# Patient Record
Sex: Male | Born: 1950 | Race: White | Hispanic: No | Marital: Married | Smoking: Never smoker
Health system: Southern US, Community
[De-identification: ages and names within clinical notes are randomized; demographics above are authoritative.]

## PROBLEM LIST (undated history)

## (undated) DIAGNOSIS — S4990XA Unspecified injury of shoulder and upper arm, unspecified arm, initial encounter: Secondary | ICD-10-CM

## (undated) DIAGNOSIS — E785 Hyperlipidemia, unspecified: Secondary | ICD-10-CM

## (undated) DIAGNOSIS — R030 Elevated blood-pressure reading, without diagnosis of hypertension: Secondary | ICD-10-CM

## (undated) HISTORY — PX: SHOULDER SURGERY: SHX246

## (undated) HISTORY — PX: TONSILLECTOMY: SUR1361

## (undated) HISTORY — DX: Unspecified injury of shoulder and upper arm, unspecified arm, initial encounter: S49.90XA

## (undated) HISTORY — PX: KNEE ARTHROSCOPY: SHX127

## (undated) HISTORY — DX: Hyperlipidemia, unspecified: E78.5

---

## 1984-11-03 HISTORY — PX: CHOLECYSTECTOMY: SHX55

## 2005-11-03 HISTORY — PX: COLONOSCOPY: SHX174

## 2006-10-23 ENCOUNTER — Other Ambulatory Visit: Payer: Self-pay

## 2006-10-29 ENCOUNTER — Ambulatory Visit: Payer: Self-pay | Admitting: Surgery

## 2006-11-03 HISTORY — PX: HERNIA REPAIR: SHX51

## 2009-04-11 ENCOUNTER — Ambulatory Visit: Payer: Self-pay | Admitting: Otolaryngology

## 2009-12-17 ENCOUNTER — Encounter: Payer: Self-pay | Admitting: Family Medicine

## 2010-01-01 ENCOUNTER — Encounter: Payer: Self-pay | Admitting: Family Medicine

## 2013-04-16 ENCOUNTER — Ambulatory Visit: Payer: Self-pay | Admitting: Family Medicine

## 2014-04-19 ENCOUNTER — Emergency Department: Payer: Self-pay | Admitting: Emergency Medicine

## 2014-04-19 LAB — CBC
HCT: 46.3 % (ref 40.0–52.0)
HCT: 47 % (ref 40.0–52.0)
HGB: 15.4 g/dL (ref 13.0–18.0)
HGB: 15.1 g/dL (ref 13.0–18.0)
MCH: 30.4 pg (ref 26.0–34.0)
MCH: 29.5 pg (ref 26.0–34.0)
MCHC: 33.3 g/dL (ref 32.0–36.0)
MCHC: 32.1 g/dL (ref 32.0–36.0)
MCV: 91 fL (ref 80–100)
MCV: 92 fL (ref 80–100)
PLATELETS: 237 10*3/uL (ref 150–440)
Platelet: 193 10*3/uL (ref 150–440)
RBC: 5.08 10*6/uL (ref 4.40–5.90)
RBC: 5.11 10*6/uL (ref 4.40–5.90)
RDW: 14.3 % (ref 11.5–14.5)
RDW: 14.6 % — ABNORMAL HIGH (ref 11.5–14.5)
WBC: 7.8 10*3/uL (ref 3.8–10.6)
WBC: 13.5 10*3/uL — AB (ref 3.8–10.6)

## 2014-04-19 LAB — PROTIME-INR
INR: 1
INR: 1
Prothrombin Time: 12.6 secs (ref 11.5–14.7)
Prothrombin Time: 12.8 secs (ref 11.5–14.7)

## 2014-04-19 LAB — APTT
ACTIVATED PTT: 27.6 s (ref 23.6–35.9)
Activated PTT: 29.2 secs (ref 23.6–35.9)

## 2015-06-08 ENCOUNTER — Ambulatory Visit (INDEPENDENT_AMBULATORY_CARE_PROVIDER_SITE_OTHER): Payer: BC Managed Care – PPO | Admitting: Family Medicine

## 2015-06-08 ENCOUNTER — Encounter: Payer: Self-pay | Admitting: Family Medicine

## 2015-06-08 VITALS — BP 118/80 | HR 80 | Ht 72.0 in | Wt 195.0 lb

## 2015-06-08 DIAGNOSIS — Z8249 Family history of ischemic heart disease and other diseases of the circulatory system: Secondary | ICD-10-CM | POA: Diagnosis not present

## 2015-06-08 DIAGNOSIS — Z8639 Personal history of other endocrine, nutritional and metabolic disease: Secondary | ICD-10-CM

## 2015-06-08 DIAGNOSIS — E782 Mixed hyperlipidemia: Secondary | ICD-10-CM | POA: Insufficient documentation

## 2015-06-08 DIAGNOSIS — E663 Overweight: Secondary | ICD-10-CM

## 2015-06-08 DIAGNOSIS — M1711 Unilateral primary osteoarthritis, right knee: Secondary | ICD-10-CM

## 2015-06-08 NOTE — Progress Notes (Signed)
Date:  06/08/2015   Name:  Anthony Foley   DOB:  [birth year]   MRN:  [id number]  PCP:  Adline Potter, MD    Chief Complaint: Establish Care   History of Present Illness:  This is a 64 y.o. male generally quite healthy, does yoga several times a week, has had R shoulder surgery, hernia repair, and R knee scope, told next step would be TKR. Father with AAA, wants checked, never smoker. Father also has MI's in 78's, mother had WPW syndrome, sister with hx cervical cancer. Pt with hx high chol on statin in past but taken off as MD said no longer needed.  Review of Systems:  Review of Systems  Constitutional: Negative for unexpected weight change.  HENT: Negative for ear pain and sore throat.   Eyes: Negative for pain.  Respiratory: Negative for shortness of breath.   Cardiovascular: Negative for chest pain, palpitations and leg swelling.  Gastrointestinal: Negative for abdominal pain.  Endocrine: Negative for polyuria.  Genitourinary: Negative for flank pain and difficulty urinating.  Musculoskeletal: Negative for back pain.  Skin: Negative for rash.  Neurological: Negative for syncope and headaches.  Hematological: Negative for adenopathy.  Psychiatric/Behavioral: Negative for confusion and sleep disturbance.    Patient Active Problem List   Diagnosis Date Noted  . FH: abdominal aortic aneurysm 06/08/2015  . Overweight (BMI 25.0-29.9) 06/08/2015  . Hx of hyperlipidemia 06/08/2015    Prior to Admission medications   Not on File    No Known Allergies  Past Surgical History  Procedure Laterality Date  . Cholecystectomy  1986  . Hernia repair  2008  . Shoulder surgery    . Knee arthroscopy    . Colonoscopy  2007    divert- [facility]    History  Substance Use Topics  . Smoking status: Never Smoker   . Smokeless tobacco: Not on file  . Alcohol Use: 0.0 oz/week    0 Standard drinks or equivalent per week    Family History  Problem Relation Age of Onset  .  Heart disease Mother   . Heart disease Father   . Cancer Sister     Medication list has been reviewed and updated.  Physical Examination: BP 118/80 mmHg  Pulse 80  Ht 6' (1.829 m)  Wt 195 lb (88.451 kg)  BMI 26.44 kg/m2  Physical Exam  Constitutional: He is oriented to person, place, and time. He appears well-developed and well-nourished.  HENT:  Head: Normocephalic and atraumatic.  Right Ear: External ear normal.  Left Ear: External ear normal.  Nose: Nose normal.  Mouth/Throat: Oropharynx is clear and moist.  Eyes: Conjunctivae and EOM are normal.  Neck: Neck supple. No thyromegaly present.  Cardiovascular: Normal rate, regular rhythm and normal heart sounds.   Pulmonary/Chest: Effort normal and breath sounds normal.  Abdominal: Soft. He exhibits no distension and no mass. There is no tenderness.  Musculoskeletal: He exhibits no edema.  Lymphadenopathy:    He has no cervical adenopathy.  Neurological: He is alert and oriented to person, place, and time. Coordination normal.  Skin: Skin is warm and dry. No rash noted.  Psychiatric: He has a normal mood and affect. His behavior is normal.    Assessment and Plan:  1. FH: abdominal aortic aneurysm - US Aorta; Future  2. Overweight (BMI 25.0-29.9) Discussed diet, exercise - Comprehensive Metabolic Panel (CMET) - CBC - Vitamin D (25 hydroxy)  3. Hx of hyperlipidemia Check level, ok with staying off statin unless  clear indication - Lipid Profile  4. Primary osteoarthritis of right knee Advised avoiding elective surgery if possible  5. FH: coronary artery disease  6. Health maintenance Need to address tetanus and colonoscopy status next vist  Return in about 6 months (around 12/09/2015).  Satira Anis. Ambree Frances, [facility]  06/08/2015

## 2015-06-08 NOTE — Addendum Note (Signed)
Addended by: Fredderick Severance on: 06/08/2015 04:07 PM   Modules accepted: Orders

## 2015-06-19 ENCOUNTER — Ambulatory Visit
Admission: RE | Admit: 2015-06-19 | Discharge: 2015-06-19 | Disposition: A | Discharge status: Home / Self Care | Payer: BC Managed Care – PPO | Source: Ambulatory Visit | Attending: Family Medicine | Admitting: Family Medicine

## 2015-06-19 DIAGNOSIS — Z8249 Family history of ischemic heart disease and other diseases of the circulatory system: Secondary | ICD-10-CM | POA: Insufficient documentation

## 2015-06-21 ENCOUNTER — Telehealth: Payer: Self-pay | Admitting: Family Medicine

## 2015-06-21 NOTE — Telephone Encounter (Signed)
Please call about overdue labs

## 2015-06-23 LAB — CBC
Hematocrit: 44.6 % (ref 37.5–51.0)
Hemoglobin: 14.9 g/dL (ref 12.6–17.7)
MCH: 29.8 pg (ref 26.6–33.0)
MCHC: 33.4 g/dL (ref 31.5–35.7)
MCV: 89 fL (ref 79–97)
Platelets: 258 10*3/uL (ref 150–379)
RBC: 5 x10E6/uL (ref 4.14–5.80)
RDW: 15 % (ref 12.3–15.4)
WBC: 5.9 10*3/uL (ref 3.4–10.8)

## 2015-06-23 LAB — COMPREHENSIVE METABOLIC PANEL
ALBUMIN: 4.5 g/dL (ref 3.6–4.8)
ALT: 16 IU/L (ref 0–44)
AST: 15 IU/L (ref 0–40)
Albumin/Globulin Ratio: 1.7 (ref 1.1–2.5)
Alkaline Phosphatase: 78 IU/L (ref 39–117)
BUN / CREAT RATIO: 20 (ref 10–22)
BUN: 21 mg/dL (ref 8–27)
Bilirubin Total: 0.4 mg/dL (ref 0.0–1.2)
CALCIUM: 9.3 mg/dL (ref 8.6–10.2)
CO2: 24 mmol/L (ref 18–29)
CREATININE: 1.04 mg/dL (ref 0.76–1.27)
Chloride: 100 mmol/L (ref 97–108)
GFR calc Af Amer: 89 mL/min/{1.73_m2} (ref >59)
GFR, EST NON AFRICAN AMERICAN: 77 mL/min/{1.73_m2} (ref >59)
GLOBULIN, TOTAL: 2.7 g/dL (ref 1.5–4.5)
Glucose: 77 mg/dL (ref 65–99)
Potassium: 4.2 mmol/L (ref 3.5–5.2)
Sodium: 141 mmol/L (ref 134–144)
TOTAL PROTEIN: 7.2 g/dL (ref 6.0–8.5)

## 2015-06-23 LAB — LIPID PANEL
CHOL/HDL RATIO: 4.7 ratio (ref 0.0–5.0)
Cholesterol, Total: 248 mg/dL — ABNORMAL HIGH (ref 100–199)
HDL: 53 mg/dL (ref >39)
LDL CALC: 166 mg/dL — AB (ref 0–99)
Triglycerides: 146 mg/dL (ref 0–149)
VLDL Cholesterol Cal: 29 mg/dL (ref 5–40)

## 2015-06-23 LAB — VITAMIN D 25 HYDROXY (VIT D DEFICIENCY, FRACTURES): Vit D, 25-Hydroxy: 27.6 ng/mL — ABNORMAL LOW (ref 30.0–100.0)

## 2015-06-25 ENCOUNTER — Encounter: Payer: Self-pay | Admitting: Family Medicine

## 2015-06-25 DIAGNOSIS — E559 Vitamin D deficiency, unspecified: Secondary | ICD-10-CM | POA: Insufficient documentation

## 2015-09-03 ENCOUNTER — Ambulatory Visit (INDEPENDENT_AMBULATORY_CARE_PROVIDER_SITE_OTHER): Payer: BC Managed Care – PPO

## 2015-09-03 DIAGNOSIS — Z23 Encounter for immunization: Secondary | ICD-10-CM | POA: Diagnosis not present

## 2016-09-04 ENCOUNTER — Ambulatory Visit (INDEPENDENT_AMBULATORY_CARE_PROVIDER_SITE_OTHER): Payer: BC Managed Care – PPO

## 2016-09-04 DIAGNOSIS — Z23 Encounter for immunization: Secondary | ICD-10-CM | POA: Diagnosis not present

## 2016-09-05 ENCOUNTER — Encounter: Payer: Self-pay | Admitting: Internal Medicine

## 2016-09-05 ENCOUNTER — Ambulatory Visit (INDEPENDENT_AMBULATORY_CARE_PROVIDER_SITE_OTHER): Payer: BC Managed Care – PPO | Admitting: Internal Medicine

## 2016-09-05 VITALS — BP 118/82 | HR 68 | Resp 16 | Ht 72.0 in | Wt 197.0 lb

## 2016-09-05 DIAGNOSIS — R1013 Epigastric pain: Secondary | ICD-10-CM | POA: Diagnosis not present

## 2016-09-05 NOTE — Progress Notes (Signed)
    Date:  09/05/2016   Name:  Anthony Foley   DOB:  01/16/1951   MRN:  [id number]   Chief Complaint: Abdominal Pain (LLQ pain under Rib x 2 wks and worse in epigastric region over last few days after eating. No BM or Urine issues at all. Going to [place] 11/24 and wants to be sure he is ok before going. ) Abdominal Pain  This is a new problem. The current episode started 1 to 4 weeks ago. The problem occurs every several days. The problem has been waxing and waning. The pain is located in the epigastric region and LLQ. Pertinent negatives include no anorexia, arthralgias, constipation, diarrhea, fever, headaches, myalgias, nausea, vomiting or weight loss. The pain is aggravated by eating.  No smoking or alcohol.  He takes advil intermittently but no other over the counter or prescription medication.    Review of Systems  Constitutional: Negative for fever and weight loss.  Respiratory: Negative for chest tightness, shortness of breath and wheezing.   Cardiovascular: Negative for chest pain.  Gastrointestinal: Positive for abdominal pain. Negative for abdominal distention, anorexia, constipation, diarrhea, nausea and vomiting.  Musculoskeletal: Negative for arthralgias and myalgias.  Neurological: Negative for headaches.    Patient Active Problem List   Diagnosis Date Noted  . Vitamin D insufficiency 06/25/2015  . FH: abdominal aortic aneurysm 06/08/2015  . Overweight (BMI 25.0-29.9) 06/08/2015  . Hx of hyperlipidemia 06/08/2015    Prior to Admission medications   Not on File    No Known Allergies  Past Surgical History:  Procedure Laterality Date  . CHOLECYSTECTOMY  1986  . COLONOSCOPY  2007   divert- [facility]  . HERNIA REPAIR  2008  . KNEE ARTHROSCOPY    . SHOULDER SURGERY      Social History  Substance Use Topics  . Smoking status: Never Smoker  . Smokeless tobacco: Never Used  . Alcohol use 0.0 oz/week     Medication list has been reviewed and  updated.   Physical Exam  Constitutional: He is oriented to person, place, and time. He appears well-developed. No distress.  HENT:  Head: Normocephalic and atraumatic.  Pulmonary/Chest: Effort normal. No respiratory distress.  Abdominal: Soft. Normal appearance and bowel sounds are normal. There is no hepatosplenomegaly. There is tenderness in the left upper quadrant. There is no rigidity, no rebound, no guarding and no CVA tenderness.  Musculoskeletal: Normal range of motion.  Neurological: He is alert and oriented to person, place, and time.  Skin: Skin is warm and dry. No rash noted.  Psychiatric: He has a normal mood and affect. His speech is normal and behavior is normal. Thought content normal.  Nursing note and vitals reviewed.   BP 118/82   Pulse 68   Resp 16   Ht 6' (1.829 m)   Wt 197 lb (89.4 kg)   BMI 26.72 kg/m   Assessment and Plan: 1. Dyspepsia Begin prilosec 20 mg bid and probiotic Return in 10 days if no improvement or worsening    Halina Maidens, MD [facility]  09/05/2016

## 2016-09-05 NOTE — Patient Instructions (Signed)
Prilosec 20 mg twice a day for 7-10 days  Probiotic capsules for 10 days as directed

## 2016-10-09 ENCOUNTER — Ambulatory Visit
Admission: EM | Admit: 2016-10-09 | Discharge: 2016-10-09 | Disposition: A | Discharge status: Home / Self Care | Payer: BC Managed Care – PPO | Attending: Family Medicine | Admitting: Family Medicine

## 2016-10-09 DIAGNOSIS — B9789 Other viral agents as the cause of diseases classified elsewhere: Secondary | ICD-10-CM

## 2016-10-09 DIAGNOSIS — J069 Acute upper respiratory infection, unspecified: Secondary | ICD-10-CM | POA: Diagnosis not present

## 2016-10-09 NOTE — Discharge Instructions (Signed)
Recommend take Delsym every 12 hours as needed for cough. Increase fluid intake to help loosen mucus in chest. Follow-up with your primary care provider in 3 to 4 days if symptoms worsen.

## 2016-10-09 NOTE — ED Triage Notes (Signed)
Pt c/o chest congestion for several days. He is just back from a trip to [place] and he was up all night coughing.

## 2016-10-09 NOTE — ED Provider Notes (Signed)
CSN: [id number]     Arrival date & time 10/09/16  0944 History   None    Chief Complaint  Patient presents with  . Nasal Congestion   (Consider location/radiation/quality/duration/timing/severity/associated sxs/prior Treatment) 65 year old male presents with cough and slight chest congestion for the past 3 days. Also had slight chills and sore throat last night. Denies any fever, nasal congestion, wheezing, shortness of breath or GI symptoms. Recently spent 10 days in [place] for work and returned 4 days ago. Had difficulty sleeping last night due to the cough but feels better today. Has taken OTC Nighttime cold medication last night with some relief. Takes no daily medication.   The history is provided by the patient.    History reviewed. No pertinent past medical history. Past Surgical History:  Procedure Laterality Date  . CHOLECYSTECTOMY  1986  . COLONOSCOPY  2007   divert- [facility]  . HERNIA REPAIR  2008  . KNEE ARTHROSCOPY    . SHOULDER SURGERY     Family History  Problem Relation Age of Onset  . Heart disease Mother   . Heart disease Father   . Cancer Sister   . Birth defects Paternal Grandfather    Social History  Substance Use Topics  . Smoking status: Never Smoker  . Smokeless tobacco: Never Used  . Alcohol use 0.0 oz/week    Review of Systems  Constitutional: Positive for chills. Negative for appetite change, fatigue and fever.  HENT: Positive for postnasal drip and sore throat. Negative for congestion, ear discharge, ear pain, rhinorrhea, sinus pain and sinus pressure.   Eyes: Negative for discharge.  Respiratory: Positive for cough. Negative for chest tightness, shortness of breath and wheezing.   Cardiovascular: Negative for chest pain.  Gastrointestinal: Negative for abdominal pain, diarrhea, nausea and vomiting.  Musculoskeletal: Negative for neck pain and neck stiffness.  Skin: Negative for rash.  Neurological: Negative for dizziness, syncope,  weakness, light-headedness, numbness and headaches.  Hematological: Negative for adenopathy.    Allergies  Patient has no known allergies.  Home Medications   Prior to Admission medications   Not on File   Meds Ordered and Administered this Visit  Medications - No data to display  BP 120/83 (BP Location: Left Arm)   Pulse 87   Temp 98.8 F (37.1 C) (Oral)   Resp 18   Ht 6' (1.829 m)   Wt 195 lb (88.5 kg)   SpO2 100%   BMI 26.45 kg/m  No data found.   Physical Exam  Constitutional: He is oriented to person, place, and time. He appears well-developed and well-nourished. He does not appear ill. No distress.  HENT:  Head: Normocephalic and atraumatic.  Right Ear: Hearing, tympanic membrane, external ear and ear canal normal.  Left Ear: Hearing, tympanic membrane, external ear and ear canal normal.  Nose: Rhinorrhea present. Right sinus exhibits no maxillary sinus tenderness and no frontal sinus tenderness. Left sinus exhibits no maxillary sinus tenderness and no frontal sinus tenderness.  Mouth/Throat: Uvula is midline and mucous membranes are normal. Posterior oropharyngeal erythema present.  Neck: Normal range of motion. Neck supple.  Cardiovascular: Normal rate, regular rhythm and normal heart sounds.   Pulmonary/Chest: Effort normal and breath sounds normal. No respiratory distress. He has no decreased breath sounds. He has no wheezes. He has no rhonchi.  Lymphadenopathy:    He has no cervical adenopathy.  Neurological: He is alert and oriented to person, place, and time.  Skin: Skin is warm and  dry.  Psychiatric: He has a normal mood and affect. His behavior is normal. Judgment and thought content normal.    Urgent Care Course   Clinical Course     Procedures (including critical care time)  Labs Review Labs Reviewed - No data to display  Imaging Review No results found.   Visual Acuity Review  Right Eye Distance:   Left Eye Distance:   Bilateral  Distance:    Right Eye Near:   Left Eye Near:    Bilateral Near:         MDM   1. Viral URI with cough    Reviewed with patient that he probably has a viral illness. Offered Rx strength cough medication but patient declined. Will trial OTC Delsym every 12 hours as needed for cough. Increase fluid intake to help loosen mucus in chest. Follow-up with his primary care provider in 3 to 4 days if not improving.     Katy Apo, NP 10/09/16 2008

## 2016-10-12 ENCOUNTER — Telehealth: Payer: Self-pay

## 2016-10-12 NOTE — Telephone Encounter (Signed)
Courtesy call back completed today for patient's recent visit at [facility]. Patient did not answer, left message on machine to call back with any questions or concerns.   

## 2016-10-15 ENCOUNTER — Encounter: Payer: Self-pay | Admitting: Family Medicine

## 2016-10-15 ENCOUNTER — Ambulatory Visit (INDEPENDENT_AMBULATORY_CARE_PROVIDER_SITE_OTHER): Payer: BC Managed Care – PPO | Admitting: Family Medicine

## 2016-10-15 VITALS — BP 122/82 | HR 71 | Resp 16 | Ht 72.0 in | Wt 199.0 lb

## 2016-10-15 DIAGNOSIS — Z8639 Personal history of other endocrine, nutritional and metabolic disease: Secondary | ICD-10-CM | POA: Diagnosis not present

## 2016-10-15 DIAGNOSIS — Z8249 Family history of ischemic heart disease and other diseases of the circulatory system: Secondary | ICD-10-CM | POA: Diagnosis not present

## 2016-10-15 DIAGNOSIS — E559 Vitamin D deficiency, unspecified: Secondary | ICD-10-CM | POA: Diagnosis not present

## 2016-10-15 DIAGNOSIS — Z23 Encounter for immunization: Secondary | ICD-10-CM | POA: Diagnosis not present

## 2016-10-15 DIAGNOSIS — J208 Acute bronchitis due to other specified organisms: Secondary | ICD-10-CM

## 2016-10-15 DIAGNOSIS — E663 Overweight: Secondary | ICD-10-CM

## 2016-10-16 LAB — VITAMIN D 25 HYDROXY (VIT D DEFICIENCY, FRACTURES): Vit D, 25-Hydroxy: 21 ng/mL — ABNORMAL LOW (ref 30.0–100.0)

## 2016-10-16 NOTE — Progress Notes (Signed)
Date:  10/15/2016   Name:  Anthony Foley   DOB:  February 13, 1951   MRN:  [id number]  PCP:  Adline Potter, MD    Chief Complaint: Annual Exam   History of Present Illness:  This is a 65 y.o. male seen for 16 month f/u. Seen MUC last week for cough, told viral, resolving. Dyspepsia from November resolved without meds. Occ pain L medial thigh, improving. Needs tetanus and zoster imm, has had two colonoscopies, last one 10 yrs ago, no polyps noted per pt. Vit D level low last year, advised increase sun exposure. Abd [place] last year negative.  Review of Systems:  Review of Systems  Constitutional: Negative for activity change, appetite change, fever and unexpected weight change.  Respiratory: Negative for cough and shortness of breath.   Cardiovascular: Negative for chest pain and leg swelling.  Gastrointestinal: Negative for abdominal pain.  Genitourinary: Negative for difficulty urinating.  Neurological: Negative for dizziness, syncope and light-headedness.    Patient Active Problem List   Diagnosis Date Noted  . Vitamin D insufficiency 06/25/2015  . FH: abdominal aortic aneurysm 06/08/2015  . Overweight (BMI 25.0-29.9) 06/08/2015  . Hx of hyperlipidemia 06/08/2015    Prior to Admission medications   Not on File    No Known Allergies  Past Surgical History:  Procedure Laterality Date  . CHOLECYSTECTOMY  1986  . COLONOSCOPY  2007   divert- [facility]  . HERNIA REPAIR  2008  . KNEE ARTHROSCOPY    . SHOULDER SURGERY      Social History  Substance Use Topics  . Smoking status: Never Smoker  . Smokeless tobacco: Never Used  . Alcohol use 0.0 oz/week    Family History  Problem Relation Age of Onset  . Heart disease Mother   . Heart disease Father   . Cancer Sister   . Birth defects Paternal Grandfather     Medication list has been reviewed and updated.  Physical Examination: BP 122/82   Pulse 71   Resp 16   Ht 6' (1.829 m)   Wt 199 lb (90.3 kg)   SpO2 100%    BMI 26.99 kg/m   Physical Exam  Constitutional: He appears well-developed and well-nourished.  Cardiovascular: Normal rate, regular rhythm and normal heart sounds.   Pulmonary/Chest: Effort normal and breath sounds normal.  Musculoskeletal: He exhibits no edema.  Neurological: He is alert.  Skin: Skin is warm and dry.  Psychiatric: He has a normal mood and affect. His behavior is normal.  Nursing note and vitals reviewed.   Assessment and Plan:  1. Viral bronchitis Resolving  2. Vitamin D insufficiency Recheck level - Vitamin D (25 hydroxy)  3. Overweight (BMI 25.0-29.9) Exercise/weight loss discussed  4. Hx of hyperlipidemia Lipids ok last year  5. FH: abdominal aortic aneurysm [place] negative last year  6. Need for diphtheria-tetanus-pertussis (Tdap) vaccine - Tdap vaccine greater than or equal to 7yo IM  7. Need for zoster vaccination Hold until Shingrix available  Return in about 1 year (around 10/15/2017).  Satira Anis. [facility]  10/16/2016

## 2017-04-17 ENCOUNTER — Ambulatory Visit
Admission: EM | Admit: 2017-04-17 | Discharge: 2017-04-17 | Disposition: A | Discharge status: Home / Self Care | Payer: BC Managed Care – PPO | Attending: Emergency Medicine | Admitting: Emergency Medicine

## 2017-04-17 ENCOUNTER — Encounter: Payer: Self-pay | Admitting: Emergency Medicine

## 2017-04-17 DIAGNOSIS — J029 Acute pharyngitis, unspecified: Secondary | ICD-10-CM | POA: Diagnosis not present

## 2017-04-17 LAB — RAPID STREP SCREEN (MED CTR MEBANE ONLY): Streptococcus, Group A Screen (Direct): NEGATIVE

## 2017-04-17 MED ORDER — IPRATROPIUM BROMIDE 0.06 % NA SOLN: 2.0000 | Freq: Four times a day (QID) | NASAL | 0 refills | Status: DC

## 2017-04-17 MED ORDER — FLUTICASONE PROPIONATE 50 MCG/ACT NA SUSP: 2.0000 | Freq: Every day | NASAL | 0 refills | Status: DC

## 2017-04-17 NOTE — ED Triage Notes (Signed)
Patient c/o sore throat for the past 4 days.  Patient denies fevers.

## 2017-04-17 NOTE — ED Provider Notes (Signed)
HPI  SUBJECTIVE:  Patient reports sore throat starting 4 days ago. Sx worse with not drinking fluids.  Sx better with ibuprofen 800 mg and drinking fluids.. Has been taking cold medicine w/ o relief.  No fevers No Swollen neck glands    + Cough/URI sxs with nasal congestion, rhinorrhea, postnasal drip No ear pain, sinus pain or pressure No Myalgias No Headache No Rash     No Recent Strep or mono Exposure No Abdominal Pain No reflux sxs No Allergy sxs  No Breathing difficulty, voice changes, sensation of throat swelling shut No Drooling No Trismus No abx in past month.  + antipyretic in past 6-8 hrs he is not a smoker He has a past medical history of seasonal allergies. No history of mono, strep, GERD, diabetes, hypertension.  [id number], Gwyndolyn Saxon, MD    History reviewed. No pertinent past medical history.  Past Surgical History:  Procedure Laterality Date  . CHOLECYSTECTOMY  1986  . COLONOSCOPY  2007   divert- [facility]  . HERNIA REPAIR  2008  . KNEE ARTHROSCOPY    . SHOULDER SURGERY      Family History  Problem Relation Age of Onset  . Heart disease Mother   . Heart disease Father   . Cancer Sister   . Birth defects Paternal Grandfather     Social History  Substance Use Topics  . Smoking status: Never Smoker  . Smokeless tobacco: Never Used  . Alcohol use 0.0 oz/week    No current facility-administered medications for this encounter.   Current Outpatient Prescriptions:  .  fluticasone (FLONASE) 50 MCG/ACT nasal spray, Place 2 sprays into both nostrils daily., Disp: 16 g, Rfl: 0 .  ipratropium (ATROVENT) 0.06 % nasal spray, Place 2 sprays into both nostrils 4 (four) times daily. 3-4 times/ day, Disp: 15 mL, Rfl: 0  No Known Allergies   ROS  As noted in HPI.   Physical Exam  BP 121/89 (BP Location: Left Arm)   Pulse 93   Temp 98.7 F (37.1 C) (Oral)   Resp 16   Ht 6' (1.829 m)   Wt 195 lb (88.5 kg)   SpO2 100%   BMI 26.45 kg/m    Constitutional: Well developed, well nourished, no acute distress Eyes:  EOMI, conjunctiva normal bilaterally HENT: Normocephalic, atraumatic,mucus membranes moist. + clear nasal congestion +erythematous oropharynx.  tonsils surgically absent. Positive cobblestoning, postnasal drip. Uvula midline.  Respiratory: Normal inspiratory effort Cardiovascular: Normal rate, no murmurs, rubs, gallops GI: nondistended, nontender. No appreciable splenomegaly skin: No rash, skin intact Lymph: -  cervical LN  Musculoskeletal: no deformities Neurologic: Alert & oriented x 3, no focal neuro deficits Psychiatric: Speech and behavior appropriate.  ED Course   Medications - No data to display  Orders Placed This Encounter  Procedures  . Rapid strep screen    Standing Status:   Standing    Number of Occurrences:   1  . Culture, group A strep    Standing Status:   Standing    Number of Occurrences:   1    Results for orders placed or performed during the hospital encounter of 04/17/17 (from the past 24 hour(s))  Rapid strep screen     Status: None   Collection Time: 04/17/17  3:21 PM  Result Value Ref Range   Streptococcus, Group A Screen (Direct) NEGATIVE NEGATIVE   No results found.  ED Clinical Impression  Pharyngitis, unspecified etiology   ED Assessment/Plan   Rapid strep negative. Obtaining throat  culture to guide antibiotic treatment. Discussed this with patient. We'll contact them if culture is positive, and will call in Appropriate antibiotics. Patient home with ibuprofen, Tylenol- states does not need a prescription for this Benadryl/Maalox mixture, Atrovent nasal spray for the nasal congestion postnasal drip and Flonase if this does not work.  Patient to followup with PMD when necessary,  Discussed labs,  MDM, plan and followup with patient  Discussed sn/sx that should prompt return to the ED. Patient.agrees with plan.   Meds ordered this encounter  Medications  .  fluticasone (FLONASE) 50 MCG/ACT nasal spray    Sig: Place 2 sprays into both nostrils daily.    Dispense:  16 g    Refill:  0  . ipratropium (ATROVENT) 0.06 % nasal spray    Sig: Place 2 sprays into both nostrils 4 (four) times daily. 3-4 times/ day    Dispense:  15 mL    Refill:  0     *This clinic note was created using [employer]. Therefore, there may be occasional mistakes despite careful proofreading.    Melynda Ripple, MD 04/17/17 [phone]

## 2017-04-17 NOTE — Discharge Instructions (Signed)
your rapid strep was negative today, so we have sent off a throat culture.  We will contact you and call in the appropriate antibiotics if your culture comes back positive for an infection requiring antibiotic treatment.  Give [place] a working phone number.   1 gram of Tylenol and 800 mg ibuprofen together 3 times a day as needed for pain.  Make sure you drink plenty of extra fluids.  Some people find salt water gargles and  Traditional Medicinal's "Throat Coat" tea helpful. Take 5 mL of liquid Benadryl and 5 mL of Maalox. Mix it together, and then hold it in your mouth for as long as you can and then swallow. You may do this 4 times a day.    Go to www.goodrx.com to look up your medications. This will give you a list of where you can find your prescriptions at the most affordable prices. Or ask the pharmacist what the cash price is. This can be less expensive than what you would pay with insurance.

## 2017-04-20 LAB — CULTURE, GROUP A STREP (THRC)

## 2017-04-21 ENCOUNTER — Encounter: Payer: Self-pay | Admitting: Family Medicine

## 2017-04-21 ENCOUNTER — Ambulatory Visit (INDEPENDENT_AMBULATORY_CARE_PROVIDER_SITE_OTHER): Payer: BC Managed Care – PPO | Admitting: Family Medicine

## 2017-04-21 VITALS — BP 122/78 | HR 79 | Resp 16 | Ht 72.0 in | Wt 197.0 lb

## 2017-04-21 DIAGNOSIS — M1710 Unilateral primary osteoarthritis, unspecified knee: Secondary | ICD-10-CM | POA: Insufficient documentation

## 2017-04-21 DIAGNOSIS — E559 Vitamin D deficiency, unspecified: Secondary | ICD-10-CM | POA: Diagnosis not present

## 2017-04-21 DIAGNOSIS — Z23 Encounter for immunization: Secondary | ICD-10-CM | POA: Diagnosis not present

## 2017-04-21 DIAGNOSIS — M719 Bursopathy, unspecified: Secondary | ICD-10-CM | POA: Insufficient documentation

## 2017-04-21 DIAGNOSIS — M6281 Muscle weakness (generalized): Secondary | ICD-10-CM | POA: Insufficient documentation

## 2017-04-21 DIAGNOSIS — M7550 Bursitis of unspecified shoulder: Secondary | ICD-10-CM

## 2017-04-21 DIAGNOSIS — J302 Other seasonal allergic rhinitis: Secondary | ICD-10-CM

## 2017-04-21 DIAGNOSIS — M25512 Pain in left shoulder: Secondary | ICD-10-CM | POA: Diagnosis not present

## 2017-04-21 DIAGNOSIS — M179 Osteoarthritis of knee, unspecified: Secondary | ICD-10-CM | POA: Insufficient documentation

## 2017-04-21 DIAGNOSIS — J309 Allergic rhinitis, unspecified: Secondary | ICD-10-CM | POA: Insufficient documentation

## 2017-04-21 DIAGNOSIS — M75112 Incomplete rotator cuff tear or rupture of left shoulder, not specified as traumatic: Secondary | ICD-10-CM | POA: Insufficient documentation

## 2017-04-21 DIAGNOSIS — M66329 Spontaneous rupture of flexor tendons, unspecified upper arm: Secondary | ICD-10-CM | POA: Insufficient documentation

## 2017-04-21 MED ORDER — ZOSTER VAC RECOMB ADJUVANTED 50 MCG/0.5ML IM SUSR: 0.5000 mL | Freq: Once | INTRAMUSCULAR | 1 refills | Status: AC

## 2017-04-21 MED ORDER — CETIRIZINE HCL 10 MG PO TABS: 10.0000 mg | ORAL_TABLET | Freq: Every day | ORAL | 11 refills | Status: DC

## 2017-04-21 NOTE — Progress Notes (Signed)
Date:  04/21/2017   Name:  Anthony Foley   DOB:  02-07-1951   MRN:  [id number]  PCP:  Adline Potter, MD    Chief Complaint: Allergies (Seen MUC Friday. He wakes with sore throat in mornings and hard to swallow. Some chest congestion but ok after coughing it up. Denies fever. )   History of Present Illness:  This is a 66 y.o. male c/o 10d hx sore throat, nasal congestion, watery eyes, sneezing. Seen MUC 4d ago, RST and throat cx negative, placed on Flonase and Atrovent NS, taking Benedryl PO. Also c/o persistent L shoulder pain similar to R shoulder pain dx'd as RC tear but not as severe, wonders if can start exercises PT taught. Needs zoster imm. Not taking vit D but more sun exposure over summer.  Review of Systems:  Review of Systems  Constitutional: Negative for chills and fever.  HENT: Negative for ear pain and trouble swallowing.   Respiratory: Negative for cough and shortness of breath.   Cardiovascular: Negative for chest pain and leg swelling.  Genitourinary: Negative for difficulty urinating.  Neurological: Negative for syncope and light-headedness.    Patient Active Problem List   Diagnosis Date Noted  . Disorder of bursae of shoulder region 04/21/2017  . Full thickness rotator cuff tear 04/21/2017  . Muscle weakness 04/21/2017  . Nontraumatic rupture of long head of biceps tendon 04/21/2017  . Osteoarthritis of knee 04/21/2017  . Shoulder joint pain 04/21/2017  . Stiffness of shoulder joint 04/21/2017  . Allergic rhinitis 04/21/2017  . Vitamin D insufficiency 06/25/2015  . FH: abdominal aortic aneurysm 06/08/2015  . Overweight (BMI 25.0-29.9) 06/08/2015  . Hx of hyperlipidemia 06/08/2015    Prior to Admission medications   Medication Sig Start Date End Date Taking? Authorizing Provider  fluticasone (FLONASE) 50 MCG/ACT nasal spray Place 2 sprays into both nostrils daily. 04/17/17  Yes Melynda Ripple, MD  cetirizine (ZYRTEC) 10 MG tablet Take 1 tablet (10 mg  total) by mouth daily. 04/21/17   Oseas Detty, Gwyndolyn Saxon, MD  Zoster Vac Recomb Adjuvanted [facility]) injection Inject 0.5 mLs into the muscle once. 04/21/17 04/21/17  Adline Potter, MD    No Known Allergies  Past Surgical History:  Procedure Laterality Date  . CHOLECYSTECTOMY  1986  . COLONOSCOPY  2007   divert- [place] [facility]  . HERNIA REPAIR  2008  . KNEE ARTHROSCOPY    . SHOULDER SURGERY      Social History  Substance Use Topics  . Smoking status: Never Smoker  . Smokeless tobacco: Never Used  . Alcohol use 0.0 oz/week    Family History  Problem Relation Age of Onset  . Heart disease Mother   . Heart disease Father   . Cancer Sister   . Birth defects Paternal Grandfather     Medication list has been reviewed and updated.  Physical Examination: BP 122/78   Pulse 79   Resp 16   Ht 6' (1.829 m)   Wt 197 lb (89.4 kg)   SpO2 99%   BMI 26.72 kg/m   Physical Exam  Constitutional: He appears well-developed and well-nourished.  HENT:  Mouth/Throat: Oropharynx is clear and moist.  No sinus tenderness  Cardiovascular: Normal rate, regular rhythm and normal heart sounds.   Pulmonary/Chest: Effort normal and breath sounds normal.  Musculoskeletal: He exhibits no edema.  L shoulder full ROM but tender over superior RC  Neurological: He is alert.  Skin: Skin is warm and dry.  Psychiatric: He has a normal  mood and affect. His behavior is normal.  Nursing note and vitals reviewed.   Assessment and Plan:  1. Seasonal allergic rhinitis, unspecified trigger Begin Zyrtec or Claritin with Flonase, d/c Benedryl and Atrovent, call if sxs worsen/persist,   2. Pain in joint of left shoulder Ok to begin home PT exercises, consider PT referral if sxs persist  3. Vitamin D insufficiency Likely improved with increased sun exposure, consider level next visit  4. Need for zoster vaccination - Zoster Vac Recomb Adjuvanted [facility]) injection; Inject 0.5 mLs into the muscle once.   Dispense: 0.5 mL; Refill: 1  Return in about 6 months (around 10/21/2017).  Satira Anis. [facility]  04/21/2017

## 2017-04-21 NOTE — Patient Instructions (Addendum)
Use Zyrtec or Claritin (generic ok) daily with Flonase, call if symptoms fail to improve. Call if left shoulder pain fails to improve with PT exercises.

## 2017-08-25 DIAGNOSIS — M25512 Pain in left shoulder: Secondary | ICD-10-CM | POA: Diagnosis not present

## 2017-08-26 ENCOUNTER — Other Ambulatory Visit: Payer: Self-pay | Admitting: Unknown Physician Specialty

## 2017-08-26 DIAGNOSIS — M25512 Pain in left shoulder: Secondary | ICD-10-CM

## 2017-08-29 ENCOUNTER — Ambulatory Visit
Admission: RE | Admit: 2017-08-29 | Discharge: 2017-08-29 | Disposition: A | Discharge status: Home / Self Care | Payer: PPO | Source: Ambulatory Visit | Attending: Unknown Physician Specialty | Admitting: Unknown Physician Specialty

## 2017-08-29 DIAGNOSIS — M7522 Bicipital tendinitis, left shoulder: Secondary | ICD-10-CM | POA: Insufficient documentation

## 2017-08-29 DIAGNOSIS — M75112 Incomplete rotator cuff tear or rupture of left shoulder, not specified as traumatic: Secondary | ICD-10-CM | POA: Insufficient documentation

## 2017-08-29 DIAGNOSIS — M25512 Pain in left shoulder: Secondary | ICD-10-CM | POA: Diagnosis present

## 2017-09-03 DIAGNOSIS — M7542 Impingement syndrome of left shoulder: Secondary | ICD-10-CM | POA: Diagnosis not present

## 2017-10-21 ENCOUNTER — Encounter: Payer: Self-pay | Admitting: Family Medicine

## 2017-10-21 ENCOUNTER — Ambulatory Visit: Payer: PPO | Admitting: Family Medicine

## 2017-10-21 VITALS — BP 118/76 | HR 76 | Resp 16 | Ht 72.0 in | Wt 195.8 lb

## 2017-10-21 DIAGNOSIS — E559 Vitamin D deficiency, unspecified: Secondary | ICD-10-CM | POA: Diagnosis not present

## 2017-10-21 DIAGNOSIS — Z0000 Encounter for general adult medical examination without abnormal findings: Secondary | ICD-10-CM | POA: Diagnosis not present

## 2017-10-21 DIAGNOSIS — Z23 Encounter for immunization: Secondary | ICD-10-CM

## 2017-10-21 DIAGNOSIS — J302 Other seasonal allergic rhinitis: Secondary | ICD-10-CM | POA: Diagnosis not present

## 2017-10-21 DIAGNOSIS — M75112 Incomplete rotator cuff tear or rupture of left shoulder, not specified as traumatic: Secondary | ICD-10-CM

## 2017-10-21 MED ORDER — CETIRIZINE HCL 10 MG PO TABS: 10.0000 mg | ORAL_TABLET | Freq: Every day | ORAL | 11 refills | Status: AC | PRN

## 2017-10-21 MED ORDER — FLUTICASONE PROPIONATE 50 MCG/ACT NA SUSP: 2.0000 | Freq: Every day | NASAL | 0 refills | Status: DC | PRN

## 2017-10-21 NOTE — Progress Notes (Signed)
Date:  10/21/2017   Name:  Anthony Foley   DOB:  Jan 05, 1951   MRN:  [id number]  PCP:  Adline Potter, MD    Chief Complaint: Follow-up (medicare wellness needed? patient inquiring )   History of Present Illness:  This is a 66 y.o. male seen for six month f/u. Saw ortho, MRI showed partial RC tear, improved with injection/PT. Using Claritin/Flonase prn for AR sxs. Off vit D supp past 2 months. Zoster imm on back order. Due for Prevnar now 77.  Review of Systems:  Review of Systems  Constitutional: Negative for chills and fever.  Respiratory: Negative for cough and shortness of breath.   Cardiovascular: Negative for chest pain and leg swelling.  Genitourinary: Negative for difficulty urinating.  Neurological: Negative for syncope and light-headedness.    Patient Active Problem List   Diagnosis Date Noted  . Disorder of bursae of shoulder region 04/21/2017  . Partial tear of left rotator cuff 04/21/2017  . Muscle weakness 04/21/2017  . Nontraumatic rupture of long head of biceps tendon 04/21/2017  . Osteoarthritis of knee 04/21/2017  . Shoulder joint pain 04/21/2017  . Stiffness of shoulder joint 04/21/2017  . Allergic rhinitis 04/21/2017  . Vitamin D insufficiency 06/25/2015  . FH: abdominal aortic aneurysm 06/08/2015  . Overweight (BMI 25.0-29.9) 06/08/2015  . Hx of hyperlipidemia 06/08/2015    Prior to Admission medications   Medication Sig Start Date End Date Taking? Authorizing Provider  cetirizine (ZYRTEC) 10 MG tablet Take 1 tablet (10 mg total) by mouth daily. 04/21/17  Yes Crystelle Ferrufino, Gwyndolyn Saxon, MD  fluticasone (FLONASE) 50 MCG/ACT nasal spray Place 2 sprays into both nostrils daily. 04/17/17  Yes Melynda Ripple, MD    No Known Allergies  Past Surgical History:  Procedure Laterality Date  . CHOLECYSTECTOMY  1986  . COLONOSCOPY  2007   divert- [place] [facility]  . HERNIA REPAIR  2008  . KNEE ARTHROSCOPY    . SHOULDER SURGERY      Social History   Tobacco Use   . Smoking status: Never Smoker  . Smokeless tobacco: Never Used  Substance Use Topics  . Alcohol use: Yes    Alcohol/week: 0.0 oz  . Drug use: No    Family History  Problem Relation Age of Onset  . Heart disease Mother   . Heart disease Father   . Cancer Sister   . Birth defects Paternal Grandfather   . COPD Maternal Aunt     Medication list has been reviewed and updated.  Physical Examination: BP 118/76   Pulse 76   Resp 16   Ht 6' (1.829 m)   Wt 195 lb 12.8 oz (88.8 kg)   SpO2 100%   BMI 26.56 kg/m   Physical Exam  Constitutional: He appears well-developed and well-nourished.  Cardiovascular: Normal rate, regular rhythm and normal heart sounds.  Pulmonary/Chest: Effort normal and breath sounds normal.  Musculoskeletal: He exhibits no edema.  Neurological: He is alert.  Skin: Skin is warm and dry.  Psychiatric: He has a normal mood and affect. His behavior is normal.  Nursing note and vitals reviewed.   Assessment and Plan:  1. Partial tear of left rotator cuff Improved with injection/PT, ortho following  2. Vitamin D insufficiency Off supp x 2 months - Vitamin D (25 hydroxy)  3. Seasonal allergic rhinitis, unspecified trigger Well controlled on prn Claritin/Flonase  4. Need for influenza vaccination - Flu vaccine HIGH DOSE PF (Fluzone High dose)  5. Need for pneumococcal vaccination -  Pneumococcal conjugate vaccine 13-valent  6. Healthcare maintenance - Hepatitis C Antibody - HIV antibody (with reflex)  Return in about 1 year (around 10/21/2018).  Satira Anis. [facility]  10/21/2017

## 2017-10-22 ENCOUNTER — Other Ambulatory Visit: Payer: Self-pay | Admitting: Family Medicine

## 2017-10-22 LAB — VITAMIN D 25 HYDROXY (VIT D DEFICIENCY, FRACTURES): Vit D, 25-Hydroxy: 28.8 ng/mL — ABNORMAL LOW (ref 30.0–100.0)

## 2017-10-22 LAB — HEPATITIS C ANTIBODY: Hep C Virus Ab: <0.1 s/co ratio (ref 0.0–0.9)

## 2017-10-22 LAB — HIV ANTIBODY (ROUTINE TESTING W REFLEX): HIV Screen 4th Generation wRfx: NONREACTIVE

## 2017-10-22 MED ORDER — VITAMIN D-3 25 MCG (1000 UT) PO CAPS: 1.0000 | ORAL_CAPSULE | Freq: Every day | ORAL | Status: AC

## 2018-09-20 ENCOUNTER — Encounter: Payer: PPO | Admitting: Family Medicine

## 2018-09-20 ENCOUNTER — Encounter: Payer: Self-pay | Admitting: Internal Medicine

## 2018-09-21 ENCOUNTER — Ambulatory Visit
Admission: RE | Admit: 2018-09-21 | Discharge: 2018-09-21 | Disposition: A | Discharge status: Home / Self Care | Payer: PPO | Source: Ambulatory Visit | Attending: Internal Medicine | Admitting: Internal Medicine

## 2018-09-21 ENCOUNTER — Ambulatory Visit (INDEPENDENT_AMBULATORY_CARE_PROVIDER_SITE_OTHER): Payer: PPO | Admitting: Internal Medicine

## 2018-09-21 ENCOUNTER — Encounter: Payer: Self-pay | Admitting: Internal Medicine

## 2018-09-21 VITALS — BP 120/80 | HR 66 | Ht 72.0 in | Wt 195.0 lb

## 2018-09-21 DIAGNOSIS — M25552 Pain in left hip: Secondary | ICD-10-CM | POA: Diagnosis not present

## 2018-09-21 DIAGNOSIS — E782 Mixed hyperlipidemia: Secondary | ICD-10-CM

## 2018-09-21 DIAGNOSIS — Z23 Encounter for immunization: Secondary | ICD-10-CM | POA: Diagnosis not present

## 2018-09-21 DIAGNOSIS — Z0000 Encounter for general adult medical examination without abnormal findings: Secondary | ICD-10-CM

## 2018-09-21 DIAGNOSIS — Z125 Encounter for screening for malignant neoplasm of prostate: Secondary | ICD-10-CM

## 2018-09-21 DIAGNOSIS — E559 Vitamin D deficiency, unspecified: Secondary | ICD-10-CM | POA: Diagnosis not present

## 2018-09-21 DIAGNOSIS — M48061 Spinal stenosis, lumbar region without neurogenic claudication: Secondary | ICD-10-CM | POA: Insufficient documentation

## 2018-09-21 DIAGNOSIS — Z1211 Encounter for screening for malignant neoplasm of colon: Secondary | ICD-10-CM

## 2018-09-21 DIAGNOSIS — M1612 Unilateral primary osteoarthritis, left hip: Secondary | ICD-10-CM | POA: Diagnosis not present

## 2018-09-21 LAB — POCT URINALYSIS DIPSTICK
BILIRUBIN UA: NEGATIVE
Glucose, UA: NEGATIVE
Ketones, UA: NEGATIVE
Leukocytes, UA: NEGATIVE
NITRITE UA: NEGATIVE
PH UA: 5 (ref 5.0–8.0)
PROTEIN UA: NEGATIVE
Spec Grav, UA: 1.015 (ref 1.010–1.025)
UROBILINOGEN UA: 0.2 U/dL

## 2018-09-21 MED ORDER — ZOSTER VAC RECOMB ADJUVANTED 50 MCG/0.5ML IM SUSR: 0.5000 mL | Freq: Once | INTRAMUSCULAR | 1 refills | Status: AC

## 2018-09-21 NOTE — Progress Notes (Signed)
Date:  09/21/2018   Name:  Anthony Foley   DOB:  [birth year]   MRN:  [id number]   Chief Complaint: Annual Exam (Was Previous [name] patient. High dose Flu shot. ) Anthony Foley is a 67 y.o. male who presents today for his Complete Annual Exam. He feels well. He reports exercising regularly. He reports he is sleeping well. He is due for a 10 yr colonoscopy.  He also needs flu vaccine.  He is also interested in shingles vaccine.  He will need to return in a month for PPV-23.  Hip Pain   There was no injury mechanism. The pain is present in the left hip. The quality of the pain is described as cramping. The pain is moderate. The pain has been intermittent since onset. Associated symptoms include an inability to bear weight and a loss of motion. He has tried nothing for the symptoms.   Inguinal pain - had a hernia repair on the right 10 years ago.  Now doing yoga and having some pulling in the right groin down to the scrotum.  No swelling or pain except when doing certain poses.  Review of Systems  Constitutional: Negative for appetite change, chills, diaphoresis, fatigue and unexpected weight change.  HENT: Negative for hearing loss, tinnitus, trouble swallowing and voice change.   Eyes: Negative for visual disturbance.  Respiratory: Negative for choking, shortness of breath and wheezing.   Cardiovascular: Positive for palpitations. Negative for chest pain and leg swelling.  Gastrointestinal: Negative for abdominal pain, blood in stool, constipation and diarrhea.  Genitourinary: Negative for difficulty urinating, dysuria and frequency.  Musculoskeletal: Negative for arthralgias, back pain and myalgias.  Skin: Negative for color change and rash.  Allergic/Immunologic: Positive for environmental allergies.  Neurological: Negative for dizziness, syncope and headaches.  Hematological: Negative for adenopathy.  Psychiatric/Behavioral: Negative for dysphoric mood and sleep disturbance.     Patient Active Problem List   Diagnosis Date Noted  . Pain of left hip joint 09/21/2018  . Disorder of bursae of shoulder region 04/21/2017  . Partial tear of left rotator cuff 04/21/2017  . Muscle weakness 04/21/2017  . Nontraumatic rupture of long head of biceps tendon 04/21/2017  . Osteoarthritis of knee 04/21/2017  . Allergic rhinitis 04/21/2017  . Vitamin D insufficiency 06/25/2015  . FH: abdominal aortic aneurysm 06/08/2015  . Overweight (BMI 25.0-29.9) 06/08/2015  . Mixed hyperlipidemia 06/08/2015    No Known Allergies  Past Surgical History:  Procedure Laterality Date  . CHOLECYSTECTOMY  1986  . COLONOSCOPY  2007   divert- [place] [facility]  . HERNIA REPAIR  2008   right inguinal  . KNEE ARTHROSCOPY    . SHOULDER SURGERY      Social History   Tobacco Use  . Smoking status: Never Smoker  . Smokeless tobacco: Never Used  Substance Use Topics  . Alcohol use: Yes    Comment: once monthly- rare  . Drug use: No     Medication list has been reviewed and updated.  Current Meds  Medication Sig  . cetirizine (ZYRTEC) 10 MG tablet Take 1 tablet (10 mg total) by mouth daily as needed for allergies.  . Cholecalciferol (VITAMIN D-3) 1000 units CAPS Take 1 capsule (1,000 Units total) by mouth daily.  . fluticasone (FLONASE) 50 MCG/ACT nasal spray Place 2 sprays into both nostrils daily as needed for allergies or rhinitis.    PHQ 2/9 Scores 10/21/2017 06/08/2015  PHQ - 2 Score 0 0    Physical  Exam  Constitutional: He is oriented to person, place, and time. He appears well-developed and well-nourished.  HENT:  Head: Normocephalic.  Right Ear: Tympanic membrane, external ear and ear canal normal.  Left Ear: Tympanic membrane, external ear and ear canal normal.  Nose: Nose normal.  Mouth/Throat: Uvula is midline and oropharynx is clear and moist.  Eyes: Pupils are equal, round, and reactive to light. Conjunctivae and EOM are normal.  Neck: Normal range of motion. Neck  supple. Carotid bruit is not present. No thyromegaly present.  Cardiovascular: Normal rate, regular rhythm, normal heart sounds and intact distal pulses.  Pulmonary/Chest: Effort normal and breath sounds normal. He has no wheezes. Right breast exhibits no mass. Left breast exhibits no mass.  Abdominal: Soft. Normal appearance and bowel sounds are normal. There is no hepatosplenomegaly. There is no tenderness. No hernia.  Musculoskeletal:       Right hip: Normal.       Left hip: He exhibits decreased range of motion. He exhibits no tenderness and no bony tenderness.  Lymphadenopathy:    He has no cervical adenopathy.  Neurological: He is alert and oriented to person, place, and time. He has normal reflexes.  Skin: Skin is warm, dry and intact.  Psychiatric: He has a normal mood and affect. His speech is normal and behavior is normal. Judgment and thought content normal.  Nursing note and vitals reviewed.   BP 120/80 (BP Location: Right Arm, Patient Position: Sitting, Cuff Size: Normal)   Pulse 66   Ht 6' (1.829 m)   Wt 195 lb (88.5 kg)   SpO2 99%   BMI 26.45 kg/m   Assessment and Plan: 1. Annual physical exam Normal exam Continue regular exercise and diet - POCT urinalysis dipstick  2. Colon cancer screening Due for 10 yr colonoscopy - Ambulatory referral to Gastroenterology  3. Prostate cancer screening DRE deferred - PSA  4. Mixed hyperlipidemia With check labs and advise - Comprehensive metabolic panel - Lipid panel  5. Vitamin D insufficiency Continue supplementation - VITAMIN D 25 Hydroxy (Vit-D Deficiency, Fractures)  6. Encounter for immunization - Flu vaccine HIGH DOSE PF  7. Pain of left hip joint Xray to rule out pathologic process or advance disease Modify exercise to reduce pain - CBC with Differential/Platelet - DG HIP UNILAT WITH PELVIS 2-3 VIEWS LEFT; Future  8. Need for shingles vaccine - Zoster Vaccine Adjuvanted [facility]) injection; Inject 0.5  mLs into the muscle once for 1 dose.  Dispense: 0.5 mL; Refill: 1   Partially dictated using Editor, commissioning. Any errors are unintentional.  Halina Maidens, MD [facility]  09/21/2018

## 2018-09-22 ENCOUNTER — Other Ambulatory Visit: Payer: Self-pay | Admitting: Internal Medicine

## 2018-09-22 ENCOUNTER — Telehealth: Payer: Self-pay

## 2018-09-22 DIAGNOSIS — E782 Mixed hyperlipidemia: Secondary | ICD-10-CM

## 2018-09-22 LAB — CBC WITH DIFFERENTIAL/PLATELET
BASOS: 1 %
Basophils Absolute: 0 10*3/uL (ref 0.0–0.2)
EOS (ABSOLUTE): 0.1 10*3/uL (ref 0.0–0.4)
Eos: 2 %
Hematocrit: 46.5 % (ref 37.5–51.0)
Hemoglobin: 15.4 g/dL (ref 13.0–17.7)
IMMATURE GRANS (ABS): 0 10*3/uL (ref 0.0–0.1)
Immature Granulocytes: 0 %
LYMPHS ABS: 1.5 10*3/uL (ref 0.7–3.1)
LYMPHS: 32 %
MCH: 29.9 pg (ref 26.6–33.0)
MCHC: 33.1 g/dL (ref 31.5–35.7)
MCV: 90 fL (ref 79–97)
Monocytes Absolute: 0.3 10*3/uL (ref 0.1–0.9)
Monocytes: 7 %
NEUTROS ABS: 2.7 10*3/uL (ref 1.4–7.0)
Neutrophils: 58 %
PLATELETS: 285 10*3/uL (ref 150–450)
RBC: 5.15 x10E6/uL (ref 4.14–5.80)
RDW: 13.4 % (ref 12.3–15.4)
WBC: 4.6 10*3/uL (ref 3.4–10.8)

## 2018-09-22 LAB — LIPID PANEL
Chol/HDL Ratio: 5 ratio (ref 0.0–5.0)
Cholesterol, Total: 249 mg/dL — ABNORMAL HIGH (ref 100–199)
HDL: 50 mg/dL (ref >39)
LDL Calculated: 174 mg/dL — ABNORMAL HIGH (ref 0–99)
Triglycerides: 127 mg/dL (ref 0–149)
VLDL CHOLESTEROL CAL: 25 mg/dL (ref 5–40)

## 2018-09-22 LAB — COMPREHENSIVE METABOLIC PANEL
ALT: 15 IU/L (ref 0–44)
AST: 16 IU/L (ref 0–40)
Albumin/Globulin Ratio: 2 (ref 1.2–2.2)
Albumin: 4.9 g/dL — ABNORMAL HIGH (ref 3.6–4.8)
Alkaline Phosphatase: 85 IU/L (ref 39–117)
BILIRUBIN TOTAL: 0.9 mg/dL (ref 0.0–1.2)
BUN/Creatinine Ratio: 16 (ref 10–24)
BUN: 19 mg/dL (ref 8–27)
CHLORIDE: 103 mmol/L (ref 96–106)
CO2: 25 mmol/L (ref 20–29)
Calcium: 9.9 mg/dL (ref 8.6–10.2)
Creatinine, Ser: 1.19 mg/dL (ref 0.76–1.27)
GFR calc non Af Amer: 63 mL/min/{1.73_m2} (ref >59)
GFR, EST AFRICAN AMERICAN: 73 mL/min/{1.73_m2} (ref >59)
GLUCOSE: 87 mg/dL (ref 65–99)
Globulin, Total: 2.5 g/dL (ref 1.5–4.5)
Potassium: 5.2 mmol/L (ref 3.5–5.2)
Sodium: 142 mmol/L (ref 134–144)
TOTAL PROTEIN: 7.4 g/dL (ref 6.0–8.5)

## 2018-09-22 LAB — VITAMIN D 25 HYDROXY (VIT D DEFICIENCY, FRACTURES): Vit D, 25-Hydroxy: 25.6 ng/mL — ABNORMAL LOW (ref 30.0–100.0)

## 2018-09-22 LAB — PSA: PROSTATE SPECIFIC AG, SERUM: 1.3 ng/mL (ref 0.0–4.0)

## 2018-09-22 MED ORDER — ATORVASTATIN CALCIUM 10 MG PO TABS: 10.0000 mg | ORAL_TABLET | Freq: Every day | ORAL | 1 refills | Status: DC

## 2018-09-22 NOTE — Telephone Encounter (Signed)
Patient informed of lab results from recent visit ( accidentally closed out tab so cannot add this note to it.)  Wants to try a statin medication to lower cholesterol. Would like medication sent to [facility] in [place].  Please Advise.

## 2018-09-22 NOTE — Telephone Encounter (Signed)
I sent in a 6 mo supply.  He needs to come back for follow up OV and labs before he runs out of refills.

## 2018-09-23 ENCOUNTER — Other Ambulatory Visit: Payer: Self-pay

## 2018-09-23 DIAGNOSIS — Z1211 Encounter for screening for malignant neoplasm of colon: Secondary | ICD-10-CM

## 2018-09-23 NOTE — Telephone Encounter (Signed)
Patient informed. Sent to front desk to schedule 6 month f/up

## 2018-10-14 ENCOUNTER — Other Ambulatory Visit: Payer: Self-pay

## 2018-10-14 MED ORDER — NA SULFATE-K SULFATE-MG SULF 17.5-3.13-1.6 GM/177ML PO SOLN: 1.0000 | Freq: Once | ORAL | 0 refills | Status: AC

## 2018-10-14 NOTE — Discharge Instructions (Signed)
General Anesthesia, Adult, Care After °These instructions provide you with information about caring for yourself after your procedure. Your health care provider may also give you more specific instructions. Your treatment has been planned according to current medical practices, but problems sometimes occur. Call your health care provider if you have any problems or questions after your procedure. °What can I expect after the procedure? °After the procedure, it is common to have: °· Vomiting. °· A sore throat. °· Mental slowness. ° °It is common to feel: °· Nauseous. °· Cold or shivery. °· Sleepy. °· Tired. °· Sore or achy, even in parts of your body where you did not have surgery. ° °Follow these instructions at home: °For at least 24 hours after the procedure: °· Do not: °? Participate in activities where you could fall or become injured. °? Drive. °? Use heavy machinery. °? Drink alcohol. °? Take sleeping pills or medicines that cause drowsiness. °? Make important decisions or sign legal documents. °? Take care of children on your own. °· Rest. °Eating and drinking °· If you vomit, drink water, juice, or soup when you can drink without vomiting. °· Drink enough fluid to keep your urine clear or pale yellow. °· Make sure you have little or no nausea before eating solid foods. °· Follow the diet recommended by your health care provider. °General instructions °· Have a responsible adult stay with you until you are awake and alert. °· Return to your normal activities as told by your health care provider. Ask your health care provider what activities are safe for you. °· Take over-the-counter and prescription medicines only as told by your health care provider. °· If you smoke, do not smoke without supervision. °· Keep all follow-up visits as told by your health care provider. This is important. °Contact a health care provider if: °· You continue to have nausea or vomiting at home, and medicines are not helpful. °· You  cannot drink fluids or start eating again. °· You cannot urinate after 8-12 hours. °· You develop a skin rash. °· You have fever. °· You have increasing redness at the site of your procedure. °Get help right away if: °· You have difficulty breathing. °· You have chest pain. °· You have unexpected bleeding. °· You feel that you are having a life-threatening or urgent problem. °This information is not intended to replace advice given to you by your health care provider. Make sure you discuss any questions you have with your health care provider. °Document Released: 01/26/2001 Document Revised: 03/24/2016 Document Reviewed: 10/04/2015 °Elsevier Interactive Patient Education © 2018 [employer] ° °

## 2018-10-15 ENCOUNTER — Ambulatory Visit: Payer: PPO | Admitting: Anesthesiology

## 2018-10-15 ENCOUNTER — Encounter
Admission: RE | Disposition: A | Discharge status: Home / Self Care | Payer: Self-pay | Source: Home / Self Care | Attending: Gastroenterology

## 2018-10-15 ENCOUNTER — Ambulatory Visit
Admission: RE | Admit: 2018-10-15 | Discharge: 2018-10-15 | Disposition: A | Discharge status: Home / Self Care | Payer: PPO | Attending: Gastroenterology | Admitting: Gastroenterology

## 2018-10-15 DIAGNOSIS — Z7951 Long term (current) use of inhaled steroids: Secondary | ICD-10-CM | POA: Insufficient documentation

## 2018-10-15 DIAGNOSIS — Z1211 Encounter for screening for malignant neoplasm of colon: Secondary | ICD-10-CM

## 2018-10-15 DIAGNOSIS — D123 Benign neoplasm of transverse colon: Secondary | ICD-10-CM | POA: Insufficient documentation

## 2018-10-15 DIAGNOSIS — Z8249 Family history of ischemic heart disease and other diseases of the circulatory system: Secondary | ICD-10-CM | POA: Diagnosis not present

## 2018-10-15 DIAGNOSIS — E785 Hyperlipidemia, unspecified: Secondary | ICD-10-CM | POA: Diagnosis not present

## 2018-10-15 DIAGNOSIS — K5730 Diverticulosis of large intestine without perforation or abscess without bleeding: Secondary | ICD-10-CM | POA: Diagnosis not present

## 2018-10-15 DIAGNOSIS — Z79899 Other long term (current) drug therapy: Secondary | ICD-10-CM | POA: Insufficient documentation

## 2018-10-15 HISTORY — PX: COLONOSCOPY WITH PROPOFOL: SHX5780

## 2018-10-15 HISTORY — PX: POLYPECTOMY: SHX5525

## 2018-10-15 SURGERY — COLONOSCOPY WITH PROPOFOL
Anesthesia: General | Site: Rectum

## 2018-10-15 MED ORDER — LIDOCAINE HCL (CARDIAC) PF 100 MG/5ML IV SOSY
PREFILLED_SYRINGE | INTRAVENOUS | Status: DC | PRN
  Administered 2018-10-15: 40 mg via INTRAVENOUS

## 2018-10-15 MED ORDER — SODIUM CHLORIDE 0.9 % IV SOLN: INTRAVENOUS | Status: DC

## 2018-10-15 MED ORDER — STERILE WATER FOR IRRIGATION IR SOLN
Status: DC | PRN
  Administered 2018-10-15: 09:00:00

## 2018-10-15 MED ORDER — LACTATED RINGERS IV SOLN
1000.0000 mL | INTRAVENOUS | Status: DC
  Administered 2018-10-15: 1000 mL via INTRAVENOUS

## 2018-10-15 MED ORDER — PROPOFOL 10 MG/ML IV BOLUS
INTRAVENOUS | Status: DC | PRN
  Administered 2018-10-15: 10 mg via INTRAVENOUS
  Administered 2018-10-15 (×4): 20 mg via INTRAVENOUS
  Administered 2018-10-15: 80 mg via INTRAVENOUS
  Administered 2018-10-15: 20 mg via INTRAVENOUS
  Administered 2018-10-15: 30 mg via INTRAVENOUS

## 2018-10-15 SURGICAL SUPPLY — 6 items
CANISTER SUCT 1200ML W/VALVE (MISCELLANEOUS) ×4 IMPLANT
GOWN CVR UNV OPN BCK APRN NK (MISCELLANEOUS) ×4 IMPLANT
GOWN ISOL THUMB LOOP REG UNIV (MISCELLANEOUS) ×4
SNARE SHORT THROW 13M SML OVAL (MISCELLANEOUS) ×4 IMPLANT
TRAP ETRAP POLY (MISCELLANEOUS) ×4 IMPLANT
WATER STERILE IRR 250ML POUR (IV SOLUTION) ×4 IMPLANT

## 2018-10-15 NOTE — Anesthesia Postprocedure Evaluation (Signed)
Anesthesia Post Note  Patient: Anthony Foley  Procedure(s) Performed: COLONOSCOPY WITH PROPOFOL (N/A Rectum) POLYPECTOMY (Rectum)  Patient location during evaluation: PACU Anesthesia Type: General Level of consciousness: awake Pain management: pain level controlled Vital Signs Assessment: post-procedure vital signs reviewed and stable Respiratory status: spontaneous breathing Cardiovascular status: blood pressure returned to baseline Postop Assessment: no headache Anesthetic complications: no    Lavonna Monarch

## 2018-10-15 NOTE — H&P (Signed)
Lucilla Lame, MD [facility] [address] [place], [place] [address] Phone: [phone] Fax : [phone]  Primary Care Physician:  Glean Hess, MD Primary Gastroenterologist:  Dr. Allen Norris  Pre-Procedure History & Physical: HPI:  CHANC KERVIN is a 67 y.o. male is here for a screening colonoscopy.   Past Medical History:  Diagnosis Date  . Shoulder injury     Past Surgical History:  Procedure Laterality Date  . CHOLECYSTECTOMY  1986  . COLONOSCOPY  2007   divert- [place] [facility]  . HERNIA REPAIR  2008   right inguinal  . KNEE ARTHROSCOPY    . SHOULDER SURGERY    . TONSILLECTOMY      Prior to Admission medications   Medication Sig Start Date End Date Taking? Authorizing Provider  atorvastatin (LIPITOR) 10 MG tablet Take 1 tablet (10 mg total) by mouth daily. 09/22/18  Yes Glean Hess, MD  cetirizine (ZYRTEC) 10 MG tablet Take 1 tablet (10 mg total) by mouth daily as needed for allergies. 10/21/17  Yes [name], Gwyndolyn Saxon, MD  Cholecalciferol (VITAMIN D-3) 1000 units CAPS Take 1 capsule (1,000 Units total) by mouth daily. 10/22/17  Yes [name], Gwyndolyn Saxon, MD  fluticasone (FLONASE) 50 MCG/ACT nasal spray Place 2 sprays into both nostrils daily as needed for allergies or rhinitis. 10/21/17  Yes [name], Gwyndolyn Saxon, MD  ibuprofen (ADVIL,MOTRIN) 200 MG tablet Take by mouth every 6 (six) hours as needed.   Yes [provider]    Allergies as of 09/23/2018  . (No Known Allergies)    Family History  Problem Relation Age of Onset  . Burdette White syndrome Mother   . Heart disease Father 38       lived to 46  . Uterine cancer Sister   . Birth defects Paternal Grandfather   . COPD Maternal Aunt     Social History   Socioeconomic History  . Marital status: Married    Spouse name: Not on file  . Number of children: Not on file  . Years of education: Not on file  . Highest education level: Not on file  Occupational History  . Not on file  Social Needs   . Financial resource strain: Patient refused  . Food insecurity:    Worry: Patient refused    Inability: Patient refused  . Transportation needs:    Medical: No    Non-medical: No  Tobacco Use  . Smoking status: Never Smoker  . Smokeless tobacco: Never Used  Substance and Sexual Activity  . Alcohol use: Yes    Comment: once monthly- rare  . Drug use: No  . Sexual activity: Yes  Lifestyle  . Physical activity:    Days per week: 4 days    Minutes per session: 50 min  . Stress: Only a little  Relationships  . Social connections:    Talks on phone: Patient refused    Gets together: Patient refused    Attends religious service: Patient refused    Active member of club or organization: Patient refused    Attends meetings of clubs or organizations: Patient refused    Relationship status: Patient refused  . Intimate partner violence:    Fear of current or ex partner: Patient refused    Emotionally abused: Patient refused    Physically abused: Patient refused    Forced sexual activity: Patient refused  Other Topics Concern  . Not on file  Social History Narrative  . Not on file    Review  of Systems: See HPI, otherwise negative ROS  Physical Exam: BP (!) 137/99   Pulse 69   Temp 97.7 F (36.5 C) (Temporal)   Resp 17   Ht 6' (1.829 m)   Wt 88.9 kg   SpO2 100%   BMI 26.58 kg/m  General:   Alert,  pleasant and cooperative in NAD Head:  Normocephalic and atraumatic. Neck:  Supple; no masses or thyromegaly. Lungs:  Clear throughout to auscultation.    Heart:  Regular rate and rhythm. Abdomen:  Soft, nontender and nondistended. Normal bowel sounds, without guarding, and without rebound.   Neurologic:  Alert and  oriented x4;  grossly normal neurologically.  Impression/Plan: MANU RUBEY is now here to undergo a screening colonoscopy.  Risks, benefits, and alternatives regarding colonoscopy have been reviewed with the patient.  Questions have been answered.  All  parties agreeable.

## 2018-10-15 NOTE — Anesthesia Procedure Notes (Signed)
Performed by: Quinntin Malter, CRNA Pre-anesthesia Checklist: Patient identified, Emergency Drugs available, Suction available, Timeout performed and Patient being monitored Patient Re-evaluated:Patient Re-evaluated prior to induction Oxygen Delivery Method: Nasal cannula Placement Confirmation: positive ETCO2       

## 2018-10-15 NOTE — Op Note (Signed)
[facility] Patient Name: Anthony Foley Procedure Date: 10/15/2018 9:00 AM MRN: [id number] Account #: [id number] Date of Birth: 03/12/1951 Admit Type: Outpatient Age: 67 Room: [facility] OR ROOM 01 Gender: Male Note Status: Finalized Procedure:            Colonoscopy Indications:          Screening for colorectal malignant neoplasm Providers:            Lucilla Lame MD, MD Referring MD:         Halina Maidens, MD (Referring MD) Medicines:            Propofol per Anesthesia Complications:        No immediate complications. Procedure:            Pre-Anesthesia Assessment:                       - Prior to the procedure, a History and Physical was                        performed, and patient medications and allergies were                        reviewed. The patient's tolerance of previous                        anesthesia was also reviewed. The risks and benefits of                        the procedure and the sedation options and risks were                        discussed with the patient. All questions were                        answered, and informed consent was obtained. Prior                        Anticoagulants: The patient has taken no previous                        anticoagulant or antiplatelet agents. ASA Grade                        Assessment: II - A patient with mild systemic disease.                        After reviewing the risks and benefits, the patient was                        deemed in satisfactory condition to undergo the                        procedure.                       After obtaining informed consent, the colonoscope was                        passed under direct vision. Throughout the procedure,  the patient's blood pressure, pulse, and oxygen                        saturations were monitored continuously. The                        Colonoscope was introduced through the anus and                         advanced to the the cecum, identified by appendiceal                        orifice and ileocecal valve. The colonoscopy was                        performed without difficulty. The patient tolerated the                        procedure well. The quality of the bowel preparation                        was excellent. Findings:      The perianal and digital rectal examinations were normal.      A 4 mm polyp was found in the transverse colon. The polyp was sessile.       The polyp was removed with a cold snare. Resection and retrieval were       complete.      Multiple small-mouthed diverticula were found in the sigmoid colon. Impression:           - One 4 mm polyp in the transverse colon, removed with                        a cold snare. Resected and retrieved.                       - Diverticulosis in the sigmoid colon. Recommendation:       - Discharge patient to home.                       - Resume previous diet.                       - Continue present medications.                       - Await pathology results.                       - Repeat colonoscopy in 5 years if polyp adenoma and 10                        years if hyperplastic Procedure Code(s):    --- Professional ---                       [phone], Colonoscopy, flexible; with removal of tumor(s),                        polyp(s), or other lesion(s) by snare technique Diagnosis Code(s):    --- Professional ---  Z12.11, Encounter for screening for malignant neoplasm                        of colon                       D12.3, Benign neoplasm of transverse colon (hepatic                        flexure or splenic flexure) CPT copyright 2018 American Medical Association. All rights reserved. The codes documented in this report are preliminary and upon coder review may  be revised to meet current compliance requirements. Lucilla Lame MD, MD 10/15/2018 9:28:37 AM This report has been signed  electronically. Number of Addenda: 0 Note Initiated On: 10/15/2018 9:00 AM Scope Withdrawal Time: 0 hours 11 minutes 10 seconds  Total Procedure Duration: 0 hours 14 minutes 28 seconds       [facility]

## 2018-10-15 NOTE — Anesthesia Preprocedure Evaluation (Addendum)
Anesthesia Evaluation  Patient identified by MRN, date of birth, ID band Patient awake    Reviewed: Allergy & Precautions, NPO status , Patient's Chart, lab work & pertinent test results, reviewed documented beta blocker date and time   Airway Mallampati: I  TM Distance: >3 FB Neck ROM: Full    Dental no notable dental hx.    Pulmonary neg pulmonary ROS,    Pulmonary exam normal breath sounds clear to auscultation       Cardiovascular Normal cardiovascular exam Rhythm:Regular Rate:Normal  HLD   Neuro/Psych negative neurological ROS  negative psych ROS   GI/Hepatic negative GI ROS, Neg liver ROS,   Endo/Other  negative endocrine ROS  Renal/GU negative Renal ROS  negative genitourinary   Musculoskeletal  (+) Arthritis ,   Abdominal Normal abdominal exam  (+)   Peds  Hematology negative hematology ROS (+)   Anesthesia Other Findings   Reproductive/Obstetrics                            Anesthesia Physical Anesthesia Plan  ASA: II  Anesthesia Plan: General   Post-op Pain Management:    Induction: Intravenous  PONV Risk Score and Plan:   Airway Management Planned: Natural Airway  Additional Equipment: None  Intra-op Plan:   Post-operative Plan:   Informed Consent: I have reviewed the patients History and Physical, chart, labs and discussed the procedure including the risks, benefits and alternatives for the proposed anesthesia with the patient or authorized representative who has indicated his/her understanding and acceptance.     Plan Discussed with: CRNA, Anesthesiologist and Surgeon  Anesthesia Plan Comments:         Anesthesia Quick Evaluation

## 2018-10-15 NOTE — Transfer of Care (Signed)
Immediate Anesthesia Transfer of Care Note  Patient: Anthony Foley  Procedure(s) Performed: COLONOSCOPY WITH PROPOFOL (N/A Rectum) POLYPECTOMY (Rectum)  Patient Location: PACU  Anesthesia Type: General  Level of Consciousness: awake, alert  and patient cooperative  Airway and Oxygen Therapy: Patient Spontanous Breathing and Patient connected to supplemental oxygen  Post-op Assessment: Post-op Vital signs reviewed, Patient's Cardiovascular Status Stable, Respiratory Function Stable, Patent Airway and No signs of Nausea or vomiting  Post-op Vital Signs: Reviewed and stable  Complications: No apparent anesthesia complications

## 2018-10-18 ENCOUNTER — Encounter: Payer: Self-pay | Admitting: Gastroenterology

## 2018-10-19 ENCOUNTER — Encounter: Payer: Self-pay | Admitting: Gastroenterology

## 2018-11-22 ENCOUNTER — Ambulatory Visit (INDEPENDENT_AMBULATORY_CARE_PROVIDER_SITE_OTHER): Payer: PPO

## 2018-11-22 DIAGNOSIS — Z23 Encounter for immunization: Secondary | ICD-10-CM

## 2018-11-22 NOTE — Progress Notes (Signed)
Patient just came in today for his second and last pneumonia vaccine. Was given in [place] in Patient L) arm. Patient tolerated well and was given VIS before leaving.

## 2019-03-18 ENCOUNTER — Other Ambulatory Visit: Payer: Self-pay | Admitting: Internal Medicine

## 2019-03-18 DIAGNOSIS — E782 Mixed hyperlipidemia: Secondary | ICD-10-CM

## 2019-03-22 ENCOUNTER — Ambulatory Visit (INDEPENDENT_AMBULATORY_CARE_PROVIDER_SITE_OTHER): Payer: PPO | Admitting: Internal Medicine

## 2019-03-22 ENCOUNTER — Other Ambulatory Visit: Payer: Self-pay

## 2019-03-22 ENCOUNTER — Encounter: Payer: Self-pay | Admitting: Internal Medicine

## 2019-03-22 VITALS — BP 118/74 | HR 70 | Ht 72.0 in | Wt 195.0 lb

## 2019-03-22 DIAGNOSIS — E782 Mixed hyperlipidemia: Secondary | ICD-10-CM

## 2019-03-22 DIAGNOSIS — M546 Pain in thoracic spine: Secondary | ICD-10-CM

## 2019-03-22 NOTE — Progress Notes (Signed)
Date:  03/22/2019   Name:  Anthony Foley   DOB:  18-Jun-1951   MRN:  [id number]   Chief Complaint: Hyperlipidemia and Back Pain (X 10 day. Mid right back pain)  Hyperlipidemia  This is a chronic problem. Pertinent negatives include no chest pain or shortness of breath. Current antihyperlipidemic treatment includes statins (started last visit). There are no compliance problems.   Back Pain  This is a new problem. The current episode started 1 to 4 weeks ago. The problem occurs constantly. The problem is unchanged. The pain is present in the thoracic spine. The quality of the pain is described as aching. The pain does not radiate. The pain is mild. Pertinent negatives include no abdominal pain, chest pain, fever, headaches or weakness.  10 yrs risk calculated 15%.  Lab Results  Component Value Date   CHOL 249 (H) 09/21/2018   HDL 50 09/21/2018   LDLCALC 174 (H) 09/21/2018   TRIG 127 09/21/2018   CHOLHDL 5.0 09/21/2018   Lab Results  Component Value Date   ALT 15 09/21/2018   AST 16 09/21/2018   ALKPHOS 85 09/21/2018   BILITOT 0.9 09/21/2018     Review of Systems  Constitutional: Negative for chills, fatigue, fever and unexpected weight change.  Respiratory: Negative for cough, chest tightness and shortness of breath.   Cardiovascular: Negative for chest pain, palpitations and leg swelling.  Gastrointestinal: Negative for abdominal pain, constipation and diarrhea.  Musculoskeletal: Positive for back pain.  Neurological: Negative for dizziness, weakness, light-headedness and headaches.  Psychiatric/Behavioral: Negative for sleep disturbance.    Patient Active Problem List   Diagnosis Date Noted  . Encounter for screening colonoscopy   . Benign neoplasm of transverse colon   . Pain of left hip joint 09/21/2018  . Disorder of bursae of shoulder region 04/21/2017  . Partial tear of left rotator cuff 04/21/2017  . Muscle weakness 04/21/2017  . Nontraumatic rupture of  long head of biceps tendon 04/21/2017  . Osteoarthritis of knee 04/21/2017  . Allergic rhinitis 04/21/2017  . Vitamin D insufficiency 06/25/2015  . FH: abdominal aortic aneurysm 06/08/2015  . Overweight (BMI 25.0-29.9) 06/08/2015  . Mixed hyperlipidemia 06/08/2015    No Known Allergies  Past Surgical History:  Procedure Laterality Date  . CHOLECYSTECTOMY  1986  . COLONOSCOPY  2007   divert- [place] [facility]  . COLONOSCOPY WITH PROPOFOL N/A 10/15/2018   Procedure: COLONOSCOPY WITH PROPOFOL;  Surgeon: Lucilla Lame, MD;  Location: [facility];  Service: Endoscopy;  Laterality: N/A;  . HERNIA REPAIR  2008   right inguinal  . KNEE ARTHROSCOPY    . POLYPECTOMY  10/15/2018   Procedure: POLYPECTOMY;  Surgeon: Lucilla Lame, MD;  Location: [facility];  Service: Endoscopy;;  . SHOULDER SURGERY    . TONSILLECTOMY      Social History   Tobacco Use  . Smoking status: Never Smoker  . Smokeless tobacco: Never Used  Substance Use Topics  . Alcohol use: Yes    Comment: once monthly- rare  . Drug use: No     Medication list has been reviewed and updated.  Current Meds  Medication Sig  . atorvastatin (LIPITOR) 10 MG tablet TAKE 1 TABLET BY MOUTH EVERY DAY  . cetirizine (ZYRTEC) 10 MG tablet Take 1 tablet (10 mg total) by mouth daily as needed for allergies.  . Cholecalciferol (VITAMIN D-3) 1000 units CAPS Take 1 capsule (1,000 Units total) by mouth daily.  . fluticasone (FLONASE) 50 MCG/ACT nasal  spray Place 2 sprays into both nostrils daily as needed for allergies or rhinitis.  Marland Kitchen ibuprofen (ADVIL,MOTRIN) 200 MG tablet Take by mouth every 6 (six) hours as needed.    PHQ 2/9 Scores 03/22/2019 10/21/2017 06/08/2015  PHQ - 2 Score 0 0 0    BP Readings from Last 3 Encounters:  03/22/19 118/74  10/15/18 131/84  09/21/18 120/80    Physical Exam Vitals signs and nursing note reviewed.  Constitutional:      General: He is not in acute distress.    Appearance: He is  well-developed.  HENT:     Head: Normocephalic and atraumatic.  Eyes:     Pupils: Pupils are equal, round, and reactive to light.  Neck:     Musculoskeletal: Normal range of motion.     Vascular: No carotid bruit.  Cardiovascular:     Rate and Rhythm: Normal rate and regular rhythm.     Pulses: Normal pulses.  Pulmonary:     Effort: Pulmonary effort is normal. No respiratory distress.     Breath sounds: No wheezing or rhonchi.  Abdominal:     General: Abdomen is flat. Bowel sounds are normal.     Palpations: Abdomen is soft. There is no mass.     Tenderness: There is no abdominal tenderness. There is no right CVA tenderness or left CVA tenderness.  Musculoskeletal: Normal range of motion.     Thoracic back: He exhibits tenderness (along left side c/w muscle spams/strain). He exhibits normal range of motion.     Right lower leg: No edema.     Left lower leg: No edema.  Lymphadenopathy:     Cervical: No cervical adenopathy.  Skin:    General: Skin is warm and dry.     Findings: No rash.  Neurological:     Mental Status: He is alert and oriented to person, place, and time.     Motor: Motor function is intact.     Gait: Gait is intact.     Deep Tendon Reflexes:     Reflex Scores:      Patellar reflexes are 2+ on the right side and 2+ on the left side.      Achilles reflexes are 2+ on the right side and 2+ on the left side. Psychiatric:        Behavior: Behavior normal.        Thought Content: Thought content normal.     Wt Readings from Last 3 Encounters:  03/22/19 195 lb (88.5 kg)  10/15/18 196 lb (88.9 kg)  09/21/18 195 lb (88.5 kg)    BP 118/74   Pulse 70   Ht 6' (1.829 m)   Wt 195 lb (88.5 kg)   SpO2 100%   BMI 26.45 kg/m   Assessment and Plan: 1. Mixed hyperlipidemia Continue atorvastain - Lipid panel - Comprehensive metabolic panel  2. Acute midline thoracic back pain Recommend heat and nsaids PRN Follow up if no improvment   Partially dictated  using Editor, commissioning. Any errors are unintentional.  Halina Maidens, MD [facility]  03/22/2019

## 2019-03-23 LAB — LIPID PANEL
Chol/HDL Ratio: 2.7 ratio (ref 0.0–5.0)
Cholesterol, Total: 151 mg/dL (ref 100–199)
HDL: 55 mg/dL (ref >39)
LDL Calculated: 82 mg/dL (ref 0–99)
Triglycerides: 72 mg/dL (ref 0–149)
VLDL Cholesterol Cal: 14 mg/dL (ref 5–40)

## 2019-03-23 LAB — COMPREHENSIVE METABOLIC PANEL
ALT: 21 IU/L (ref 0–44)
AST: 17 IU/L (ref 0–40)
Albumin/Globulin Ratio: 2.5 — ABNORMAL HIGH (ref 1.2–2.2)
Albumin: 5 g/dL — ABNORMAL HIGH (ref 3.8–4.8)
Alkaline Phosphatase: 87 IU/L (ref 39–117)
BUN/Creatinine Ratio: 16 (ref 10–24)
BUN: 19 mg/dL (ref 8–27)
Bilirubin Total: 0.8 mg/dL (ref 0.0–1.2)
CO2: 24 mmol/L (ref 20–29)
Calcium: 9.4 mg/dL (ref 8.6–10.2)
Chloride: 107 mmol/L — ABNORMAL HIGH (ref 96–106)
Creatinine, Ser: 1.21 mg/dL (ref 0.76–1.27)
GFR calc Af Amer: 72 mL/min/{1.73_m2} (ref >59)
GFR calc non Af Amer: 62 mL/min/{1.73_m2} (ref >59)
Globulin, Total: 2 g/dL (ref 1.5–4.5)
Glucose: 86 mg/dL (ref 65–99)
Potassium: 4.5 mmol/L (ref 3.5–5.2)
Sodium: 144 mmol/L (ref 134–144)
Total Protein: 7 g/dL (ref 6.0–8.5)

## 2019-04-04 DIAGNOSIS — M179 Osteoarthritis of knee, unspecified: Secondary | ICD-10-CM | POA: Diagnosis not present

## 2019-04-04 DIAGNOSIS — M1711 Unilateral primary osteoarthritis, right knee: Secondary | ICD-10-CM | POA: Diagnosis not present

## 2019-04-04 DIAGNOSIS — M1612 Unilateral primary osteoarthritis, left hip: Secondary | ICD-10-CM | POA: Diagnosis not present

## 2019-09-14 ENCOUNTER — Ambulatory Visit (INDEPENDENT_AMBULATORY_CARE_PROVIDER_SITE_OTHER): Payer: PPO

## 2019-09-14 VITALS — BP 125/83 | Temp 96.6°F | Ht 72.0 in | Wt 188.0 lb

## 2019-09-14 DIAGNOSIS — Z0000 Encounter for general adult medical examination without abnormal findings: Secondary | ICD-10-CM | POA: Diagnosis not present

## 2019-09-14 NOTE — Patient Instructions (Signed)
Anthony Foley , Thank you for taking time to come for your Medicare Wellness Visit. I appreciate your ongoing commitment to your health goals. Please review the following plan we discussed and let me know if I can assist you in the future.   Screening recommendations/referrals: Colonoscopy: done 10/15/18. Repeat in 2024 Recommended yearly ophthalmology/optometry visit for glaucoma screening and checkup Recommended yearly dental visit for hygiene and checkup  Vaccinations: Influenza vaccine: done 08/26/19 Pneumococcal vaccine: done 1/20/0 Tdap vaccine: done 10/15/16 Shingles vaccine: 1st dose of Shingrix done 08/26/19    Advanced directives: Advance directive discussed with you today. I have provided a copy for you to complete at home and have notarized. Once this is complete please bring a copy in to our office so we can scan it into your chart.  Conditions/risks identified: Keep up the great work!  Next appointment: Please follow up in one year for your Medicare Annual Wellness visit.    Preventive Care 11 Years and Older, Male Preventive care refers to lifestyle choices and visits with your health care provider that can promote health and wellness. What does preventive care include?  A yearly physical exam. This is also called an annual well check.  Dental exams once or twice a year.  Routine eye exams. Ask your health care provider how often you should have your eyes checked.  Personal lifestyle choices, including:  Daily care of your teeth and gums.  Regular physical activity.  Eating a healthy diet.  Avoiding tobacco and drug use.  Limiting alcohol use.  Practicing safe sex.  Taking low doses of aspirin every day.  Taking vitamin and mineral supplements as recommended by your health care provider. What happens during an annual well check? The services and screenings done by your health care provider during your annual well check will depend on your age, overall  health, lifestyle risk factors, and family history of disease. Counseling  Your health care provider may ask you questions about your:  Alcohol use.  Tobacco use.  Drug use.  Emotional well-being.  Home and relationship well-being.  Sexual activity.  Eating habits.  History of falls.  Memory and ability to understand (cognition).  Work and work Statistician. Screening  You may have the following tests or measurements:  Height, weight, and BMI.  Blood pressure.  Lipid and cholesterol levels. These may be checked every 5 years, or more frequently if you are over 8 years old.  Skin check.  Lung cancer screening. You may have this screening every year starting at age 24 if you have a 30-pack-year history of smoking and currently smoke or have quit within the past 15 years.  Fecal occult blood test (FOBT) of the stool. You may have this test every year starting at age 73.  Flexible sigmoidoscopy or colonoscopy. You may have a sigmoidoscopy every 5 years or a colonoscopy every 10 years starting at age 109.  Prostate cancer screening. Recommendations will vary depending on your family history and other risks.  Hepatitis C blood test.  Hepatitis B blood test.  Sexually transmitted disease (STD) testing.  Diabetes screening. This is done by checking your blood sugar (glucose) after you have not eaten for a while (fasting). You may have this done every 1-3 years.  Abdominal aortic aneurysm (AAA) screening. You may need this if you are a current or former smoker.  Osteoporosis. You may be screened starting at age 90 if you are at high risk. Talk with your health care provider about your  test results, treatment options, and if necessary, the need for more tests. Vaccines  Your health care provider may recommend certain vaccines, such as:  Influenza vaccine. This is recommended every year.  Tetanus, diphtheria, and acellular pertussis (Tdap, Td) vaccine. You may need a Td  booster every 10 years.  Zoster vaccine. You may need this after age 59.  Pneumococcal 13-valent conjugate (PCV13) vaccine. One dose is recommended after age 35.  Pneumococcal polysaccharide (PPSV23) vaccine. One dose is recommended after age 59. Talk to your health care provider about which screenings and vaccines you need and how often you need them. This information is not intended to replace advice given to you by your health care provider. Make sure you discuss any questions you have with your health care provider. Document Released: 11/16/2015 Document Revised: 07/09/2016 Document Reviewed: 08/21/2015 Elsevier Interactive Patient Education  2017 [employer] Prevention in the Home Falls can cause injuries. They can happen to people of all ages. There are many things you can do to make your home safe and to help prevent falls. What can I do on the outside of my home?  Regularly fix the edges of walkways and driveways and fix any cracks.  Remove anything that might make you trip as you walk through a door, such as a raised step or threshold.  Trim any bushes or trees on the path to your home.  Use bright outdoor lighting.  Clear any walking paths of anything that might make someone trip, such as rocks or tools.  Regularly check to see if handrails are loose or broken. Make sure that both sides of any steps have handrails.  Any raised decks and porches should have guardrails on the edges.  Have any leaves, snow, or ice cleared regularly.  Use sand or salt on walking paths during winter.  Clean up any spills in your garage right away. This includes oil or grease spills. What can I do in the bathroom?  Use night lights.  Install grab bars by the toilet and in the tub and shower. Do not use towel bars as grab bars.  Use non-skid mats or decals in the tub or shower.  If you need to sit down in the shower, use a plastic, non-slip stool.  Keep the floor dry. Clean up  any water that spills on the floor as soon as it happens.  Remove soap buildup in the tub or shower regularly.  Attach bath mats securely with double-sided non-slip rug tape.  Do not have throw rugs and other things on the floor that can make you trip. What can I do in the bedroom?  Use night lights.  Make sure that you have a light by your bed that is easy to reach.  Do not use any sheets or blankets that are too big for your bed. They should not hang down onto the floor.  Have a firm chair that has side arms. You can use this for support while you get dressed.  Do not have throw rugs and other things on the floor that can make you trip. What can I do in the kitchen?  Clean up any spills right away.  Avoid walking on wet floors.  Keep items that you use a lot in easy-to-reach places.  If you need to reach something above you, use a strong step stool that has a grab bar.  Keep electrical cords out of the way.  Do not use floor polish or wax that  makes floors slippery. If you must use wax, use non-skid floor wax.  Do not have throw rugs and other things on the floor that can make you trip. What can I do with my stairs?  Do not leave any items on the stairs.  Make sure that there are handrails on both sides of the stairs and use them. Fix handrails that are broken or loose. Make sure that handrails are as long as the stairways.  Check any carpeting to make sure that it is firmly attached to the stairs. Fix any carpet that is loose or worn.  Avoid having throw rugs at the top or bottom of the stairs. If you do have throw rugs, attach them to the floor with carpet tape.  Make sure that you have a light switch at the top of the stairs and the bottom of the stairs. If you do not have them, ask someone to add them for you. What else can I do to help prevent falls?  Wear shoes that:  Do not have high heels.  Have rubber bottoms.  Are comfortable and fit you well.  Are  closed at the toe. Do not wear sandals.  If you use a stepladder:  Make sure that it is fully opened. Do not climb a closed stepladder.  Make sure that both sides of the stepladder are locked into place.  Ask someone to hold it for you, if possible.  Clearly mark and make sure that you can see:  Any grab bars or handrails.  First and last steps.  Where the edge of each step is.  Use tools that help you move around (mobility aids) if they are needed. These include:  Canes.  Walkers.  Scooters.  Crutches.  Turn on the lights when you go into a dark area. Replace any light bulbs as soon as they burn out.  Set up your furniture so you have a clear path. Avoid moving your furniture around.  If any of your floors are uneven, fix them.  If there are any pets around you, be aware of where they are.  Review your medicines with your doctor. Some medicines can make you feel dizzy. This can increase your chance of falling. Ask your doctor what other things that you can do to help prevent falls. This information is not intended to replace advice given to you by your health care provider. Make sure you discuss any questions you have with your health care provider. Document Released: 08/16/2009 Document Revised: 03/27/2016 Document Reviewed: 11/24/2014 Elsevier Interactive Patient Education  2017 [employer].

## 2019-09-14 NOTE — Progress Notes (Addendum)
Subjective:   Anthony Foley is a 68 y.o. male who presents for an Initial Medicare Annual Wellness Visit.  Virtual Visit via Telephone Note  I connected with Anthony Foley on 09/14/19 at 10:00 AM EST by telephone and verified that I am speaking with the correct person using two identifiers.  Medicare Annual Wellness visit completed telephonically due to Covid-19 pandemic.   Location: Patient: home Provider: office   I discussed the limitations, risks, security and privacy concerns of performing an evaluation and management service by telephone and the availability of in person appointments. The patient expressed understanding and agreed to proceed.  Some vital signs may be absent or patient reported.   Clemetine Marker, LPN    Review of Systems   Cardiac Risk Factors include: advanced age (>51men, >68 women);dyslipidemia;male gender    Objective:    Today's Vitals   09/14/19 1007  BP: 125/83  Temp: (!) 96.6 F (35.9 C)  Weight: 188 lb (85.3 kg)  Height: 6' (1.829 m)   Body mass index is 25.5 kg/m.  Advanced Directives 09/14/2019 10/15/2018 04/17/2017 10/15/2016 06/08/2015  Does Patient Have a Medical Advance Directive? No No No No No  Would patient like information on creating a medical advance directive? Yes (MAU/Ambulatory/Procedural Areas - Information given) No - Patient declined No - Patient declined - No - patient declined information    Current Medications (verified) Outpatient Encounter Medications as of 09/14/2019  Medication Sig  . atorvastatin (LIPITOR) 10 MG tablet TAKE 1 TABLET BY MOUTH EVERY DAY  . cetirizine (ZYRTEC) 10 MG tablet Take 1 tablet (10 mg total) by mouth daily as needed for allergies.  . Cholecalciferol (VITAMIN D-3) 1000 units CAPS Take 1 capsule (1,000 Units total) by mouth daily.  . fluticasone (FLONASE) 50 MCG/ACT nasal spray Place 2 sprays into both nostrils daily as needed for allergies or rhinitis.  Marland Kitchen ibuprofen (ADVIL,MOTRIN)  200 MG tablet Take by mouth every 6 (six) hours as needed.   No facility-administered encounter medications on file as of 09/14/2019.     Allergies (verified) Patient has no known allergies.   History: Past Medical History:  Diagnosis Date  . Hyperlipidemia   . Shoulder injury    Past Surgical History:  Procedure Laterality Date  . CHOLECYSTECTOMY  1986  . COLONOSCOPY  2007   divert- [place] [facility]  . COLONOSCOPY WITH PROPOFOL N/A 10/15/2018   Procedure: COLONOSCOPY WITH PROPOFOL;  Surgeon: Anthony Lame, MD;  Location: [facility];  Service: Endoscopy;  Laterality: N/A;  . HERNIA REPAIR  2008   right inguinal  . KNEE ARTHROSCOPY    . POLYPECTOMY  10/15/2018   Procedure: POLYPECTOMY;  Surgeon: Anthony Lame, MD;  Location: [facility];  Service: Endoscopy;;  . SHOULDER SURGERY    . TONSILLECTOMY     Family History  Problem Relation Age of Onset  . Anthony Foley syndrome Mother   . Heart disease Father 34       lived to 34  . Uterine cancer Sister   . Birth defects Paternal Grandfather   . COPD Maternal Aunt    Social History   Socioeconomic History  . Marital status: Married    Spouse name: Not on file  . Number of children: Not on file  . Years of education: Not on file  . Highest education level: Not on file  Occupational History  . Not on file  Social Needs  . Financial resource strain: Not hard at all  .  Food insecurity    Worry: Never true    Inability: Never true  . Transportation needs    Medical: No    Non-medical: No  Tobacco Use  . Smoking status: Never Smoker  . Smokeless tobacco: Never Used  Substance and Sexual Activity  . Alcohol use: Yes    Comment: once monthly- rare  . Drug use: No  . Sexual activity: Yes  Lifestyle  . Physical activity    Days per week: 6 days    Minutes per session: 50 min  . Stress: Only a little  Relationships  . Social Herbalist on phone: Patient refused    Gets together:  Patient refused    Attends religious service: Patient refused    Active member of club or organization: Patient refused    Attends meetings of clubs or organizations: Patient refused    Relationship status: Married  Other Topics Concern  . Not on file  Social History Narrative  . Not on file   Tobacco Counseling Counseling given: Not Answered   Clinical Intake:  Pre-visit preparation completed: Yes  Pain : No/denies pain     BMI - recorded: 25.5 Nutritional Status: BMI 25 -29 Overweight Nutritional Risks: None Diabetes: No  How often do you need to have someone help you when you read instructions, pamphlets, or other written materials from your doctor or pharmacy?: 1 - Never  Interpreter Needed?: No  Information entered by :: Clemetine Marker LPN  Activities of Daily Living In your present state of health, do you have any difficulty performing the following activities: 09/14/2019 10/15/2018  Hearing? N N  Comment declines hearing aids -  Vision? N N  Difficulty concentrating or making decisions? N N  Walking or climbing stairs? N N  Dressing or bathing? N N  Doing errands, shopping? N -  Preparing Food and eating ? N -  Using the Toilet? N -  In the past six months, have you accidently leaked urine? N -  Do you have problems with loss of bowel control? N -  Managing your Medications? N -  Managing your Finances? N -  Housekeeping or managing your Housekeeping? N -  Some recent data might be hidden     Immunizations and Health Maintenance Immunization History  Administered Date(s) Administered  . Influenza, High Dose Seasonal PF 10/21/2017, 09/21/2018, 08/26/2019  . Influenza,inj,Quad PF,6+ Mos 09/03/2015, 09/04/2016  . Pneumococcal Conjugate-13 10/21/2017  . Pneumococcal Polysaccharide-23 11/22/2018  . Tdap 10/15/2016  . Zoster Recombinat (Shingrix) 08/26/2019   There are no preventive care reminders to display for this patient.  Patient Care Team:  Glean Hess, MD as PCP - General (Internal Medicine) Jannet Mantis, MD (Dermatology) Vear Clock, MD as Referring Physician (Orthopedic Surgery)  Indicate any recent Medical Services you may have received from other than [facility] providers in the past year (date may be approximate).    Assessment:   This is a routine wellness examination for Marrell.  Hearing/Vision screen  Hearing Screening   125Hz  250Hz  500Hz  1000Hz  2000Hz  3000Hz  4000Hz  6000Hz  8000Hz   Right ear:           Left ear:           Comments: Pt denies hearing difficulty   Vision Screening Comments: Annual vision screenings at [facility] in [facility] Dr. Wyatt Portela  Dietary issues and exercise activities discussed: Current Exercise Habits: Home exercise routine, Type of exercise: Other - see comments(elliptical), Time (Minutes): 50, Frequency (Times/Week): 6,  Weekly Exercise (Minutes/Week): 300, Intensity: Moderate, Exercise limited by: None identified  Goals    . Weight (lb) < 175 lb (79.4 kg)     Pt would like to lose weight over the next year with healthy eating and physical activity      Depression Screen PHQ 2/9 Scores 09/14/2019 03/22/2019 10/21/2017 06/08/2015  PHQ - 2 Score 0 0 0 0    Fall Risk Fall Risk  09/14/2019 03/22/2019 10/21/2017 06/08/2015  Falls in the past year? 0 0 No No  Number falls in past yr: 0 0 - -  Injury with Fall? 0 0 - -  Follow up Falls prevention discussed Falls evaluation completed - -   FALL RISK PREVENTION PERTAINING TO THE HOME:  Any stairs in or around the home? Yes  If so, do they handrails? Yes   Home free of loose throw rugs in walkways, pet beds, electrical cords, etc? Yes  Adequate lighting in your home to reduce risk of falls? Yes   ASSISTIVE DEVICES UTILIZED TO PREVENT FALLS:  Life alert? No  Use of a cane, walker or w/c? No  Grab bars in the bathroom? No  Shower chair or bench in shower? Yes  Elevated toilet seat or a handicapped toilet? No   DME ORDERS:   DME order needed?  No   TIMED UP AND GO:  Was the test performed? No . Telephonic visit.   Education: Fall risk prevention has been discussed.  Intervention(s) required? No   Cognitive Function:     6CIT Screen 09/14/2019  What Year? 0 points  What month? 0 points  What time? 0 points  Count back from 20 0 points  Months in reverse 0 points  Repeat phrase 0 points  Total Score 0    Screening Tests Health Maintenance  Topic Date Due  . COLONOSCOPY  10/16/2023  . TETANUS/TDAP  10/15/2026  . INFLUENZA VACCINE  Completed  . Hepatitis C Screening  Completed  . PNA vac Low Risk Adult  Completed    Qualifies for Shingles Vaccine? Yes  1st dose of Shingrix completed.   Tdap: Up to date  Flu Vaccine: Up to date  Pneumococcal Vaccine: Up to date  Cancer Screenings:  Colorectal Screening: Completed 10/15/18. Repeat every 5 years;   Lung Cancer Screening: (Low Dose CT Chest recommended if Age 19-80 years, 30 pack-year currently smoking OR have quit w/in 15years.) does not qualify.    Additional Screening:  Hepatitis C Screening: does qualify; Completed 10/21/17  Vision Screening: Recommended annual ophthalmology exams for early detection of glaucoma and other disorders of the eye. Is the patient up to date with their annual eye exam?  Yes  Who is the provider or what is the name of the office in which the pt attends annual eye exams? Dr. Wyatt Portela  Dental Screening: Recommended annual dental exams for proper oral hygiene  Community Resource Referral:  CRR required this visit?  No       Plan:    I have personally reviewed and addressed the Medicare Annual Wellness questionnaire and have noted the following in the patient's chart:  A. Medical and social history B. Use of alcohol, tobacco or illicit drugs  C. Current medications and supplements D. Functional ability and status E.  Nutritional status F.  Physical activity G. Advance directives H. List of other  physicians I.  Hospitalizations, surgeries, and ER visits in previous 12 months J.  [place] such as hearing and vision if needed,  cognitive and depression L. Referrals and appointments   In addition, I have reviewed and discussed with patient certain preventive protocols, quality metrics, and best practice recommendations. A written personalized care plan for preventive services as well as general preventive health recommendations were provided to patient.   Signed,  Clemetine Marker, LPN Nurse Health Advisor   Nurse Notes: none

## 2019-09-23 ENCOUNTER — Ambulatory Visit (INDEPENDENT_AMBULATORY_CARE_PROVIDER_SITE_OTHER): Payer: PPO | Admitting: Internal Medicine

## 2019-09-23 ENCOUNTER — Other Ambulatory Visit: Payer: Self-pay

## 2019-09-23 ENCOUNTER — Encounter: Payer: Self-pay | Admitting: Internal Medicine

## 2019-09-23 VITALS — BP 116/77 | HR 74 | Resp 16 | Ht 72.0 in | Wt 189.0 lb

## 2019-09-23 DIAGNOSIS — E782 Mixed hyperlipidemia: Secondary | ICD-10-CM

## 2019-09-23 DIAGNOSIS — Z125 Encounter for screening for malignant neoplasm of prostate: Secondary | ICD-10-CM

## 2019-09-23 DIAGNOSIS — Z0000 Encounter for general adult medical examination without abnormal findings: Secondary | ICD-10-CM

## 2019-09-23 LAB — POCT URINALYSIS DIPSTICK
Bilirubin, UA: NEGATIVE
Glucose, UA: NEGATIVE
Ketones, UA: NEGATIVE
Leukocytes, UA: NEGATIVE
Nitrite, UA: NEGATIVE
Protein, UA: NEGATIVE
Spec Grav, UA: 1.01 (ref 1.010–1.025)
Urobilinogen, UA: 0.2 E.U./dL
pH, UA: 6 (ref 5.0–8.0)

## 2019-09-23 MED ORDER — ROSUVASTATIN CALCIUM 5 MG PO TABS: 5.0000 mg | ORAL_TABLET | Freq: Every day | ORAL | 3 refills | Status: DC

## 2019-09-23 NOTE — Progress Notes (Signed)
Date:  09/23/2019   Name:  Anthony Foley   DOB:  1951-01-03   MRN:  [id number]   Chief Complaint: Annual Exam VICKTOR ACOCELLA is a 68 y.o. male who presents today for his Complete Annual Exam. He feels well. He reports exercising regularly. He reports he is sleeping well.   Colonoscopy  10/2018 Immunizations - UTD  Hyperlipidemia The problem is controlled. Pertinent negatives include no chest pain, myalgias or shortness of breath. Current antihyperlipidemic treatment includes statins. The current treatment provides significant improvement of lipids. There are no compliance problems (possible memory issues).  There are no known risk factors for coronary artery disease.    Lab Results  Component Value Date   CREATININE 1.21 03/22/2019   BUN 19 03/22/2019   NA 144 03/22/2019   K 4.5 03/22/2019   CL 107 (H) 03/22/2019   CO2 24 03/22/2019   Lab Results  Component Value Date   CHOL 151 03/22/2019   HDL 55 03/22/2019   LDLCALC 82 03/22/2019   TRIG 72 03/22/2019   CHOLHDL 2.7 03/22/2019   No results found for: TSH No results found for: HGBA1C   Review of Systems  Constitutional: Negative for appetite change, chills, diaphoresis, fatigue and unexpected weight change.  HENT: Negative for hearing loss, tinnitus, trouble swallowing and voice change.   Eyes: Negative for visual disturbance.  Respiratory: Negative for choking, shortness of breath and wheezing.   Cardiovascular: Negative for chest pain, palpitations and leg swelling.  Gastrointestinal: Negative for abdominal pain, blood in stool, constipation and diarrhea.  Genitourinary: Negative for difficulty urinating, dysuria and frequency.  Musculoskeletal: Negative for arthralgias, back pain and myalgias.  Skin: Negative for color change and rash.  Allergic/Immunologic: Negative for environmental allergies.  Neurological: Negative for dizziness, syncope and headaches.  Hematological: Negative for adenopathy.   Psychiatric/Behavioral: Negative for dysphoric mood and sleep disturbance.    Patient Active Problem List   Diagnosis Date Noted  . Encounter for screening colonoscopy   . Benign neoplasm of transverse colon   . Pain of left hip joint 09/21/2018  . Disorder of bursae of shoulder region 04/21/2017  . Partial tear of left rotator cuff 04/21/2017  . Muscle weakness 04/21/2017  . Nontraumatic rupture of long head of biceps tendon 04/21/2017  . Osteoarthritis of knee 04/21/2017  . Allergic rhinitis 04/21/2017  . Vitamin D insufficiency 06/25/2015  . FH: abdominal aortic aneurysm 06/08/2015  . Overweight (BMI 25.0-29.9) 06/08/2015  . Mixed hyperlipidemia 06/08/2015    No Known Allergies  Past Surgical History:  Procedure Laterality Date  . CHOLECYSTECTOMY  1986  . COLONOSCOPY  2007   divert- [place] [facility]  . COLONOSCOPY WITH PROPOFOL N/A 10/15/2018   Procedure: COLONOSCOPY WITH PROPOFOL;  Surgeon: Lucilla Lame, MD;  Location: [facility];  Service: Endoscopy;  Laterality: N/A;  . HERNIA REPAIR  2008   right inguinal  . KNEE ARTHROSCOPY    . POLYPECTOMY  10/15/2018   Procedure: POLYPECTOMY;  Surgeon: Lucilla Lame, MD;  Location: [facility];  Service: Endoscopy;;  . SHOULDER SURGERY    . TONSILLECTOMY      Social History   Tobacco Use  . Smoking status: Never Smoker  . Smokeless tobacco: Never Used  Substance Use Topics  . Alcohol use: Yes    Comment: once monthly- rare  . Drug use: No     Medication list has been reviewed and updated.  Current Meds  Medication Sig  . atorvastatin (LIPITOR) 10 MG  tablet TAKE 1 TABLET BY MOUTH EVERY DAY  . cetirizine (ZYRTEC) 10 MG tablet Take 1 tablet (10 mg total) by mouth daily as needed for allergies.  . Cholecalciferol (VITAMIN D-3) 1000 units CAPS Take 1 capsule (1,000 Units total) by mouth daily.  . fluticasone (FLONASE) 50 MCG/ACT nasal spray Place 2 sprays into both nostrils daily as needed for allergies or  rhinitis.  Marland Kitchen ibuprofen (ADVIL,MOTRIN) 200 MG tablet Take by mouth every 6 (six) hours as needed.    PHQ 2/9 Scores 09/23/2019 09/14/2019 03/22/2019 10/21/2017  PHQ - 2 Score 0 0 0 0    BP Readings from Last 3 Encounters:  09/23/19 116/77  09/14/19 125/83  03/22/19 118/74    Physical Exam Vitals signs and nursing note reviewed.  Constitutional:      Appearance: Normal appearance. He is well-developed.  HENT:     Head: Normocephalic.     Right Ear: Tympanic membrane, ear canal and external ear normal.     Left Ear: Tympanic membrane, ear canal and external ear normal.     Nose: Nose normal.     Mouth/Throat:     Pharynx: Uvula midline.  Eyes:     Conjunctiva/sclera: Conjunctivae normal.     Pupils: Pupils are equal, round, and reactive to light.  Neck:     Musculoskeletal: Normal range of motion and neck supple.     Thyroid: No thyromegaly.     Vascular: No carotid bruit.  Cardiovascular:     Rate and Rhythm: Normal rate and regular rhythm.     Pulses: Normal pulses.     Heart sounds: Normal heart sounds.  Pulmonary:     Effort: Pulmonary effort is normal.     Breath sounds: Normal breath sounds. No wheezing.  Chest:     Breasts:        Right: No mass.        Left: No mass.  Abdominal:     General: Bowel sounds are normal.     Palpations: Abdomen is soft.     Tenderness: There is no abdominal tenderness.  Musculoskeletal: Normal range of motion.     Right lower leg: No edema.     Left lower leg: No edema.  Lymphadenopathy:     Cervical: No cervical adenopathy.  Skin:    General: Skin is warm and dry.     Capillary Refill: Capillary refill takes less than 2 seconds.  Neurological:     General: No focal deficit present.     Mental Status: He is alert and oriented to person, place, and time.     Deep Tendon Reflexes: Reflexes are normal and symmetric.  Psychiatric:        Speech: Speech normal.        Behavior: Behavior normal.        Thought Content: Thought  content normal.        Judgment: Judgment normal.     Wt Readings from Last 3 Encounters:  09/23/19 189 lb (85.7 kg)  09/14/19 188 lb (85.3 kg)  03/22/19 195 lb (88.5 kg)    BP 116/77   Pulse 74   Resp 16   Ht 6' (1.829 m)   Wt 189 lb (85.7 kg)   SpO2 100%   BMI 25.63 kg/m   Assessment and Plan: 1. Annual physical exam Normal exam Continue healthy diet, regular exercise BP monitoring at home Low sodium diet - CBC with Differential/Platelet - POCT urinalysis dipstick - dip positive for 3+ blood  but micro negative  2. Prostate cancer screening DRE deferred - PSA  3. Mixed hyperlipidemia Some concern of memory issues on lipitor Will switch to Crestor 5 mg - Comprehensive metabolic panel - Lipid panel - rosuvastatin (CRESTOR) 5 MG tablet; Take 1 tablet (5 mg total) by mouth daily.  Dispense: 90 tablet; Refill: 3   Partially dictated using Editor, commissioning. Any errors are unintentional.  Halina Maidens, MD [facility]  09/23/2019

## 2019-09-23 NOTE — Patient Instructions (Addendum)
Check BP several times a week.  Goal is 130/80 or less.  Continue to exercise and limit sodium.   If BP is greater than 140/90 more than half the time, please schedule a follow appointment.   DASH Eating Plan DASH stands for "Dietary Approaches to Stop Hypertension." The DASH eating plan is a healthy eating plan that has been shown to reduce high blood pressure (hypertension). It may also reduce your risk for type 2 diabetes, heart disease, and stroke. The DASH eating plan may also help with weight loss. What are tips for following this plan?  General guidelines  Avoid eating more than 2,300 mg (milligrams) of salt (sodium) a day. If you have hypertension, you may need to reduce your sodium intake to 1,500 mg a day.  Limit alcohol intake to no more than 1 drink a day for nonpregnant women and 2 drinks a day for men. One drink equals 12 oz of beer, 5 oz of wine, or 1 oz of hard liquor.  Work with your health care provider to maintain a healthy body weight or to lose weight. Ask what an ideal weight is for you.  Get at least 30 minutes of exercise that causes your heart to beat faster (aerobic exercise) most days of the week. Activities may include walking, swimming, or biking.  Work with your health care provider or diet and nutrition specialist (dietitian) to adjust your eating plan to your individual calorie needs. Reading food labels   Check food labels for the amount of sodium per serving. Choose foods with less than 5 percent of the Daily Value of sodium. Generally, foods with less than 300 mg of sodium per serving fit into this eating plan.  To find whole grains, look for the word "whole" as the first word in the ingredient list. Shopping  Buy products labeled as "low-sodium" or "no salt added."  Buy fresh foods. Avoid canned foods and premade or frozen meals. Cooking  Avoid adding salt when cooking. Use salt-free seasonings or herbs instead of table salt or sea salt. Check with  your health care provider or pharmacist before using salt substitutes.  Do not fry foods. Cook foods using healthy methods such as baking, boiling, grilling, and broiling instead.  Cook with heart-healthy oils, such as olive, canola, soybean, or sunflower oil. Meal planning  Eat a balanced diet that includes: ? 5 or more servings of fruits and vegetables each day. At each meal, try to fill half of your plate with fruits and vegetables. ? Up to 6-8 servings of whole grains each day. ? Less than 6 oz of lean meat, poultry, or fish each day. A 3-oz serving of meat is about the same size as a deck of cards. One egg equals 1 oz. ? 2 servings of low-fat dairy each day. ? A serving of nuts, seeds, or beans 5 times each week. ? Heart-healthy fats. Healthy fats called Omega-3 fatty acids are found in foods such as flaxseeds and coldwater fish, like sardines, salmon, and mackerel.  Limit how much you eat of the following: ? Canned or prepackaged foods. ? Food that is high in trans fat, such as fried foods. ? Food that is high in saturated fat, such as fatty meat. ? Sweets, desserts, sugary drinks, and other foods with added sugar. ? Full-fat dairy products.  Do not salt foods before eating.  Try to eat at least 2 vegetarian meals each week.  Eat more home-cooked food and less restaurant, buffet, and fast  food.  When eating at a restaurant, ask that your food be prepared with less salt or no salt, if possible. What foods are recommended? The items listed may not be a complete list. Talk with your dietitian about what dietary choices are best for you. Grains Whole-grain or whole-wheat bread. Whole-grain or whole-wheat pasta. Brown rice. [place]. Bulgur. Whole-grain and low-sodium cereals. Pita bread. Low-fat, low-sodium crackers. Whole-wheat flour tortillas. Vegetables Fresh or frozen vegetables (raw, steamed, roasted, or grilled). Low-sodium or reduced-sodium tomato and vegetable  juice. Low-sodium or reduced-sodium tomato sauce and tomato paste. Low-sodium or reduced-sodium canned vegetables. Fruits All fresh, dried, or frozen fruit. Canned fruit in natural juice (without added sugar). Meat and other protein foods Skinless chicken or [place]. Ground chicken or [place]. Pork with fat trimmed off. Fish and seafood. Egg whites. Dried beans, peas, or lentils. Unsalted nuts, nut butters, and seeds. Unsalted canned beans. Lean cuts of beef with fat trimmed off. Low-sodium, lean deli meat. Dairy Low-fat (1%) or fat-free (skim) milk. Fat-free, low-fat, or reduced-fat cheeses. Nonfat, low-sodium ricotta or cottage cheese. Low-fat or nonfat yogurt. Low-fat, low-sodium cheese. Fats and oils Soft margarine without trans fats. Vegetable oil. Low-fat, reduced-fat, or light mayonnaise and salad dressings (reduced-sodium). Canola, safflower, olive, soybean, and sunflower oils. Avocado. Seasoning and other foods Herbs. Spices. Seasoning mixes without salt. Unsalted popcorn and pretzels. Fat-free sweets. What foods are not recommended? The items listed may not be a complete list. Talk with your dietitian about what dietary choices are best for you. Grains Baked goods made with fat, such as croissants, muffins, or some breads. Dry pasta or rice meal packs. Vegetables Creamed or fried vegetables. Vegetables in a cheese sauce. Regular canned vegetables (not low-sodium or reduced-sodium). Regular canned tomato sauce and paste (not low-sodium or reduced-sodium). Regular tomato and vegetable juice (not low-sodium or reduced-sodium). Angie Fava. Olives. Fruits Canned fruit in a light or heavy syrup. Fried fruit. Fruit in cream or butter sauce. Meat and other protein foods Fatty cuts of meat. Ribs. Fried meat. Berniece Salines. Sausage. Bologna and other processed lunch meats. Salami. Fatback. Hotdogs. Bratwurst. Salted nuts and seeds. Canned beans with added salt. Canned or smoked fish. Whole eggs or egg yolks.  Chicken or [place] with skin. Dairy Whole or 2% milk, cream, and half-and-half. Whole or full-fat cream cheese. Whole-fat or sweetened yogurt. Full-fat cheese. Nondairy creamers. Whipped toppings. Processed cheese and cheese spreads. Fats and oils Butter. Stick margarine. Lard. Shortening. Ghee. Bacon fat. Tropical oils, such as coconut, palm kernel, or palm oil. Seasoning and other foods Salted popcorn and pretzels. Onion salt, garlic salt, seasoned salt, table salt, and sea salt. Worcestershire sauce. Tartar sauce. Barbecue sauce. Teriyaki sauce. Soy sauce, including reduced-sodium. Steak sauce. Canned and packaged gravies. Fish sauce. Oyster sauce. Cocktail sauce. Horseradish that you find on the shelf. Ketchup. Mustard. Meat flavorings and tenderizers. Bouillon cubes. Hot sauce and Tabasco sauce. Premade or packaged marinades. Premade or packaged taco seasonings. Relishes. Regular salad dressings. Where to find more information:  National Heart, Lung, and [place]: https://wilson-eaton.com/  American Heart Association: www.heart.org Summary  The DASH eating plan is a healthy eating plan that has been shown to reduce high blood pressure (hypertension). It may also reduce your risk for type 2 diabetes, heart disease, and stroke.  With the DASH eating plan, you should limit salt (sodium) intake to 2,300 mg a day. If you have hypertension, you may need to reduce your sodium intake to 1,500 mg a day.  When on the DASH  eating plan, aim to eat more fresh fruits and vegetables, whole grains, lean proteins, low-fat dairy, and heart-healthy fats.  Work with your health care provider or diet and nutrition specialist (dietitian) to adjust your eating plan to your individual calorie needs. This information is not intended to replace advice given to you by your health care provider. Make sure you discuss any questions you have with your health care provider. Document Released: 10/09/2011 Document Revised:  10/02/2017 Document Reviewed: 10/13/2016 Elsevier Patient Education  2020 [employer].

## 2019-09-24 LAB — CBC WITH DIFFERENTIAL/PLATELET
Basophils Absolute: 0 10*3/uL (ref 0.0–0.2)
Basos: 0 %
EOS (ABSOLUTE): 0.1 10*3/uL (ref 0.0–0.4)
Eos: 1 %
Hematocrit: 44.8 % (ref 37.5–51.0)
Hemoglobin: 14.8 g/dL (ref 13.0–17.7)
Immature Grans (Abs): 0 10*3/uL (ref 0.0–0.1)
Immature Granulocytes: 0 %
Lymphocytes Absolute: 1.7 10*3/uL (ref 0.7–3.1)
Lymphs: 31 %
MCH: 30.3 pg (ref 26.6–33.0)
MCHC: 33 g/dL (ref 31.5–35.7)
MCV: 92 fL (ref 79–97)
Monocytes Absolute: 0.3 10*3/uL (ref 0.1–0.9)
Monocytes: 6 %
Neutrophils Absolute: 3.3 10*3/uL (ref 1.4–7.0)
Neutrophils: 62 %
Platelets: 226 10*3/uL (ref 150–450)
RBC: 4.89 x10E6/uL (ref 4.14–5.80)
RDW: 13.6 % (ref 11.6–15.4)
WBC: 5.4 10*3/uL (ref 3.4–10.8)

## 2019-09-24 LAB — LIPID PANEL
Chol/HDL Ratio: 2.8 ratio (ref 0.0–5.0)
Cholesterol, Total: 163 mg/dL (ref 100–199)
HDL: 58 mg/dL (ref >39)
LDL Chol Calc (NIH): 90 mg/dL (ref 0–99)
Triglycerides: 78 mg/dL (ref 0–149)
VLDL Cholesterol Cal: 15 mg/dL (ref 5–40)

## 2019-09-24 LAB — COMPREHENSIVE METABOLIC PANEL
ALT: 17 IU/L (ref 0–44)
AST: 16 IU/L (ref 0–40)
Albumin/Globulin Ratio: 2.1 (ref 1.2–2.2)
Albumin: 4.9 g/dL — ABNORMAL HIGH (ref 3.8–4.8)
Alkaline Phosphatase: 89 IU/L (ref 39–117)
BUN/Creatinine Ratio: 23 (ref 10–24)
BUN: 26 mg/dL (ref 8–27)
Bilirubin Total: 0.6 mg/dL (ref 0.0–1.2)
CO2: 22 mmol/L (ref 20–29)
Calcium: 9.7 mg/dL (ref 8.6–10.2)
Chloride: 104 mmol/L (ref 96–106)
Creatinine, Ser: 1.13 mg/dL (ref 0.76–1.27)
GFR calc Af Amer: 77 mL/min/{1.73_m2} (ref >59)
GFR calc non Af Amer: 67 mL/min/{1.73_m2} (ref >59)
Globulin, Total: 2.3 g/dL (ref 1.5–4.5)
Glucose: 94 mg/dL (ref 65–99)
Potassium: 4.4 mmol/L (ref 3.5–5.2)
Sodium: 142 mmol/L (ref 134–144)
Total Protein: 7.2 g/dL (ref 6.0–8.5)

## 2019-09-24 LAB — PSA: Prostate Specific Ag, Serum: 0.9 ng/mL (ref 0.0–4.0)

## 2020-03-08 IMAGING — CR DG HIP (WITH OR WITHOUT PELVIS) 2-3V*L*
3 series · 3 of 3 positions shown · non-contrast
Comparison: None.

CLINICAL DATA: 67-year-old male with left lateral hip pain off and
on for 3-4 years worse over the past 6 months. Does yoga. No known
injury. Initial encounter.

EXAM:
DG HIP (WITH OR WITHOUT PELVIS) 2-3V LEFT

[pelvis ap]
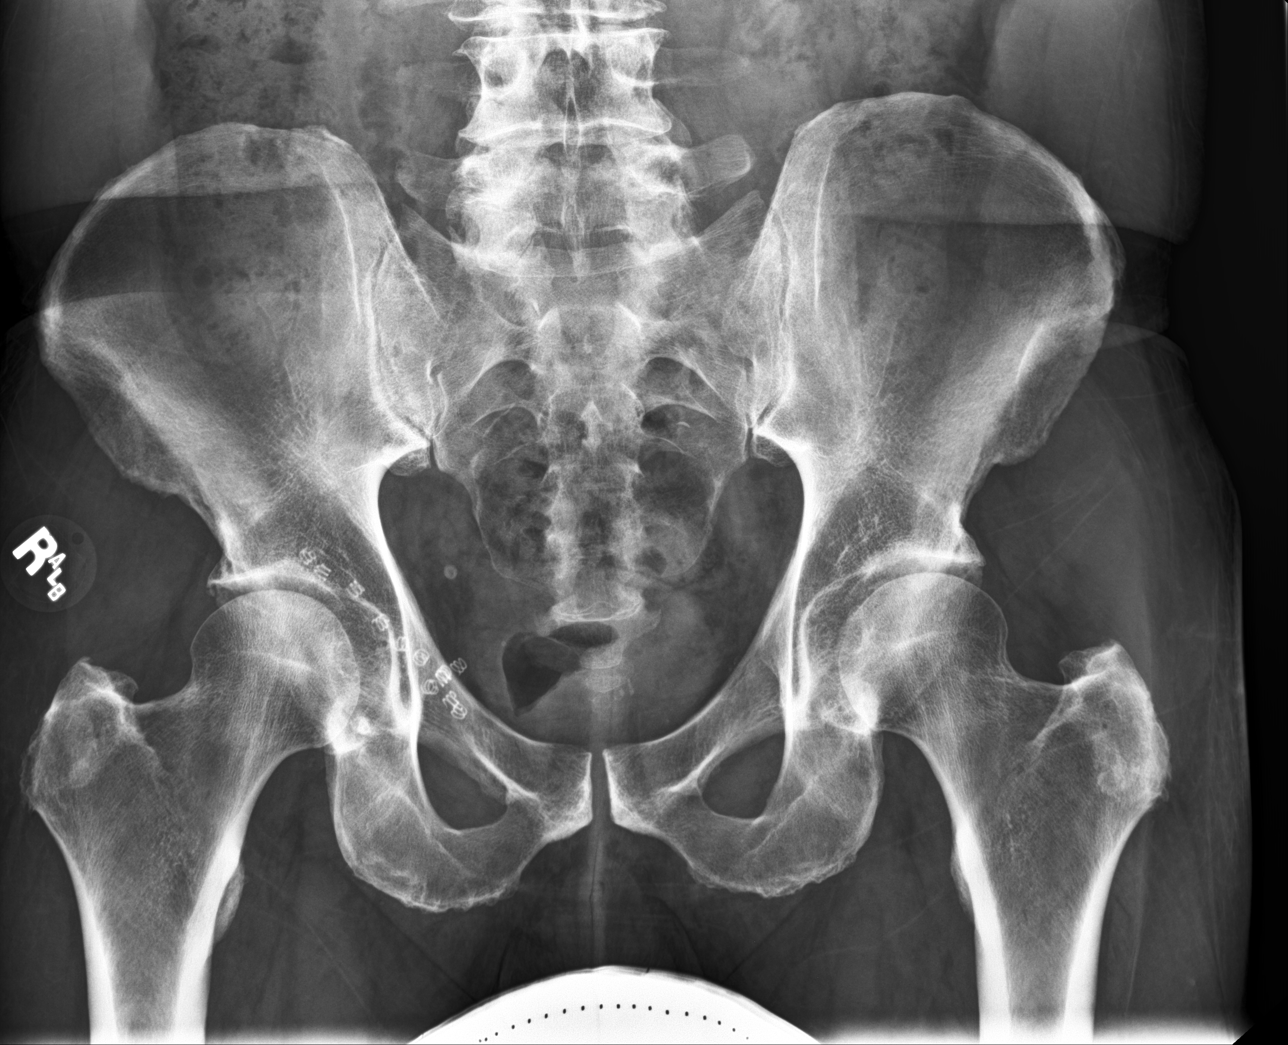

[hip ap]
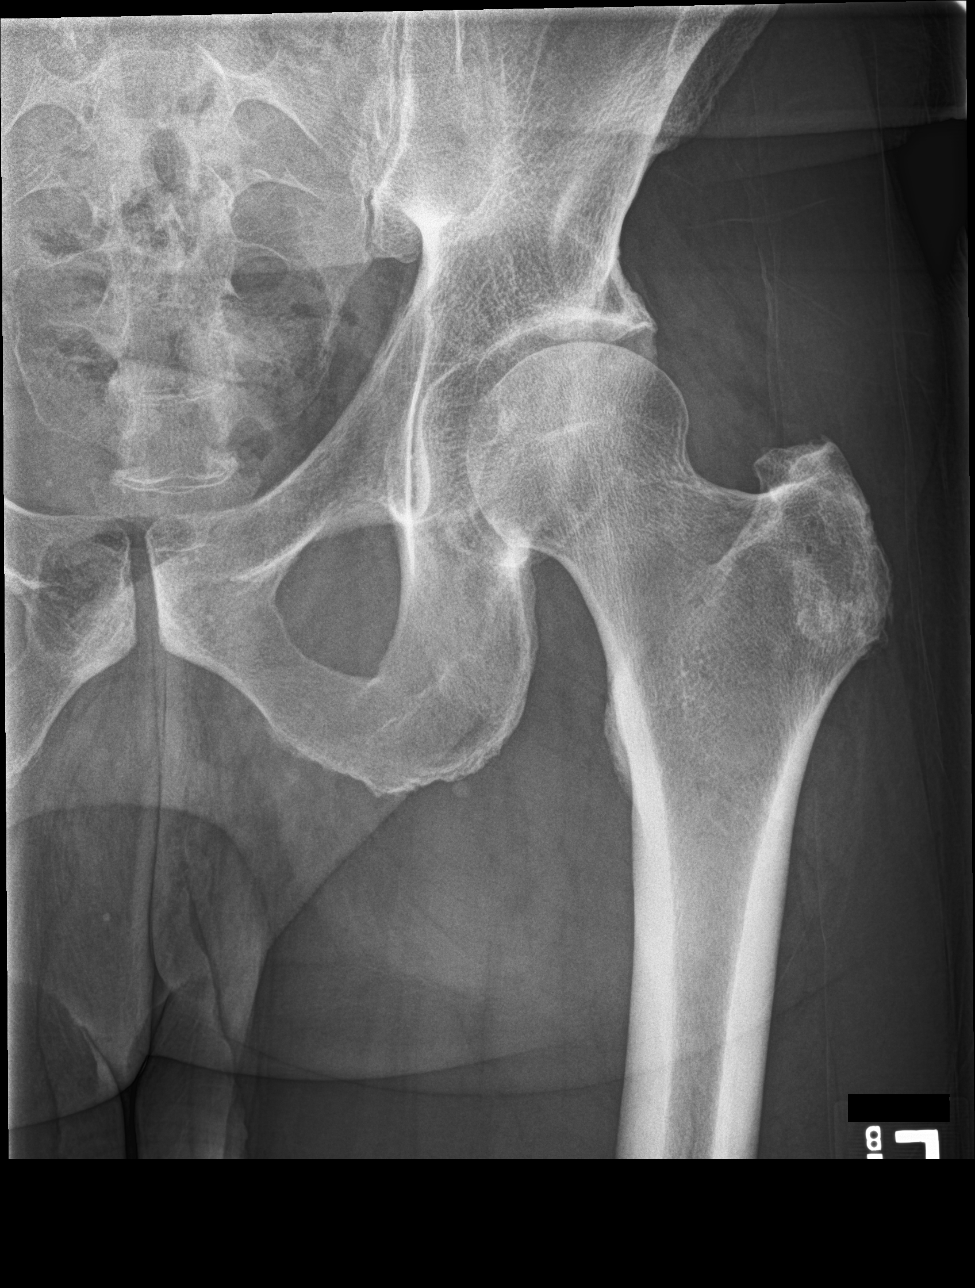

[hip lat]
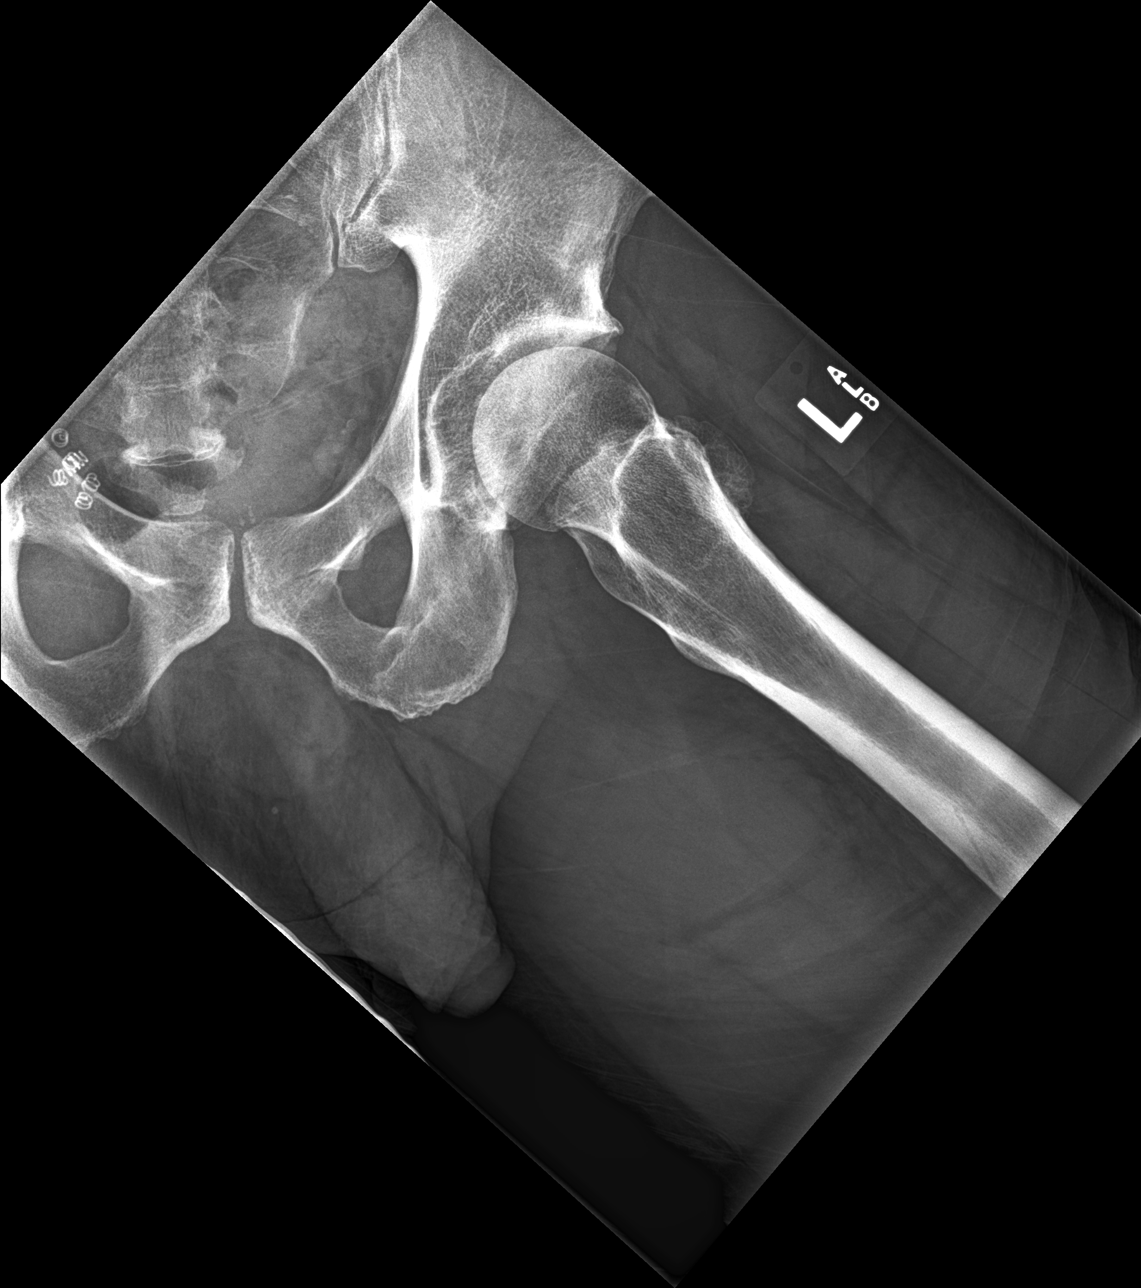

[3 of 3 positions shown; findings below may reference images not displayed]

FINDINGS: Mild bilateral hip joint degenerative changes. No fracture or
dislocation. No plain film evidence of left femoral head avascular
necrosis.

Bony overgrowth greater trochanter bilaterally. On left frogleg
lateral view, this has appearance raising possibility of exostosis
however, this is more likely related to enthesopathy.

Prior right hernia repair. Sacroiliac joints appear intact. L3-4 and
L4-5 disc space narrowing.
IMPRESSION: 1. Mild bilateral hip joint degenerative changes.
2. Bony overgrowth greater trochanter bilaterally. On left frogleg
lateral view, this has appearance raising possibility of exostosis
however, this is more likely related to enthesopathy.
3. L3-4 and L4-5 disc space narrowing.

## 2020-05-08 ENCOUNTER — Other Ambulatory Visit: Payer: Self-pay

## 2020-05-08 ENCOUNTER — Emergency Department
Admission: EM | Admit: 2020-05-08 | Discharge: 2020-05-08 | Disposition: A | Discharge status: Home / Self Care | Payer: PPO | Attending: Emergency Medicine | Admitting: Emergency Medicine

## 2020-05-08 ENCOUNTER — Emergency Department: Payer: PPO

## 2020-05-08 ENCOUNTER — Encounter: Payer: Self-pay | Admitting: Emergency Medicine

## 2020-05-08 ENCOUNTER — Telehealth: Payer: Self-pay | Admitting: Internal Medicine

## 2020-05-08 DIAGNOSIS — R109 Unspecified abdominal pain: Secondary | ICD-10-CM

## 2020-05-08 DIAGNOSIS — N5082 Scrotal pain: Secondary | ICD-10-CM | POA: Diagnosis not present

## 2020-05-08 DIAGNOSIS — E785 Hyperlipidemia, unspecified: Secondary | ICD-10-CM | POA: Diagnosis not present

## 2020-05-08 DIAGNOSIS — K4030 Unilateral inguinal hernia, with obstruction, without gangrene, not specified as recurrent: Secondary | ICD-10-CM | POA: Diagnosis not present

## 2020-05-08 DIAGNOSIS — D123 Benign neoplasm of transverse colon: Secondary | ICD-10-CM | POA: Insufficient documentation

## 2020-05-08 DIAGNOSIS — K4090 Unilateral inguinal hernia, without obstruction or gangrene, not specified as recurrent: Secondary | ICD-10-CM | POA: Insufficient documentation

## 2020-05-08 DIAGNOSIS — N419 Inflammatory disease of prostate, unspecified: Secondary | ICD-10-CM | POA: Diagnosis not present

## 2020-05-08 DIAGNOSIS — R1031 Right lower quadrant pain: Secondary | ICD-10-CM

## 2020-05-08 DIAGNOSIS — Z79899 Other long term (current) drug therapy: Secondary | ICD-10-CM | POA: Diagnosis not present

## 2020-05-08 DIAGNOSIS — N50819 Testicular pain, unspecified: Secondary | ICD-10-CM | POA: Diagnosis present

## 2020-05-08 DIAGNOSIS — N50811 Right testicular pain: Secondary | ICD-10-CM

## 2020-05-08 LAB — URINALYSIS, COMPLETE (UACMP) WITH MICROSCOPIC
Bacteria, UA: NONE SEEN
Bilirubin Urine: NEGATIVE
Glucose, UA: NEGATIVE mg/dL
Ketones, ur: NEGATIVE mg/dL
Leukocytes,Ua: NEGATIVE
Nitrite: NEGATIVE
Protein, ur: NEGATIVE mg/dL
Specific Gravity, Urine: 1.003 — ABNORMAL LOW (ref 1.005–1.030)
Squamous Epithelial / HPF: NONE SEEN (ref 0–5)
WBC, UA: NONE SEEN WBC/hpf (ref 0–5)
pH: 8 (ref 5.0–8.0)

## 2020-05-08 MED ORDER — NAPROXEN 375 MG PO TABS: 375.0000 mg | ORAL_TABLET | Freq: Two times a day (BID) | ORAL | 0 refills | Status: DC

## 2020-05-08 MED ORDER — TRAMADOL HCL 50 MG PO TABS: 50.0000 mg | ORAL_TABLET | Freq: Four times a day (QID) | ORAL | 0 refills | Status: DC | PRN

## 2020-05-08 NOTE — ED Notes (Signed)
See triage note  Presents with pain to to right testicle/groin area noticed sxs' on Friday.

## 2020-05-08 NOTE — Discharge Instructions (Addendum)
Call urologist to schedule appointment for definitive evaluation of ultrasound findings..  Tell them you are afollow-up from emergency room visit today.

## 2020-05-08 NOTE — Telephone Encounter (Signed)
Copied from [place] [phone]. Topic: Appointment Scheduling - Scheduling Inquiry for Clinic >> May 08, 2020  8:03 AM Celene Kras wrote: Reason for CRM: Pt called stating that he has had pain in x3 days in his right testicle that is now moving to his back. Pt is requesting an appt today if possible. Screening Complete. Please advise.

## 2020-05-08 NOTE — ED Triage Notes (Signed)
Pt reports started with some aching to his right testicle Friday and since then the pain has spread to his right groin and back. Pt denies swelling or urinary sx's. Pt reports sitting still he has no pain but moving there is an ache and it gets worse when he bends over.

## 2020-05-08 NOTE — ED Provider Notes (Signed)
[facility] Emergency Department Provider Note   ____________________________________________   First MD Initiated Contact with Patient 05/08/20 1056     (approximate)  I have reviewed the triage vital signs and the nursing notes.   HISTORY  Chief Complaint Testicle Pain    HPI Anthony Foley is a 69 y.o. male patient complain of aching in the right testicle which started 4 days ago.  Patient stated pain is spread to the right groin area and into his back.  Patient denies scrotum edema or dysuria.  Patient denies urethral discharge.  Patient states pain increases with movement.  No pain at this time.         Past Medical History:  Diagnosis Date   Hyperlipidemia    Shoulder injury     Patient Active Problem List   Diagnosis Date Noted   Encounter for screening colonoscopy    Benign neoplasm of transverse colon    Pain of left hip joint 09/21/2018   Disorder of bursae of shoulder region 04/21/2017   Partial tear of left rotator cuff 04/21/2017   Muscle weakness 04/21/2017   Nontraumatic rupture of long head of biceps tendon 04/21/2017   Osteoarthritis of knee 04/21/2017   Allergic rhinitis 04/21/2017   Vitamin D insufficiency 06/25/2015   FH: abdominal aortic aneurysm 06/08/2015   Overweight (BMI 25.0-29.9) 06/08/2015   Mixed hyperlipidemia 06/08/2015    Past Surgical History:  Procedure Laterality Date   CHOLECYSTECTOMY  1986   COLONOSCOPY  2007   divert- [place] [facility]   COLONOSCOPY WITH PROPOFOL N/A 10/15/2018   Procedure: COLONOSCOPY WITH PROPOFOL;  Surgeon: Lucilla Lame, MD;  Location: [facility];  Service: Endoscopy;  Laterality: N/A;   HERNIA REPAIR  2008   right inguinal   KNEE ARTHROSCOPY     POLYPECTOMY  10/15/2018   Procedure: POLYPECTOMY;  Surgeon: Lucilla Lame, MD;  Location: [facility];  Service: Endoscopy;;   SHOULDER SURGERY     TONSILLECTOMY      Prior to Admission  medications   Medication Sig Start Date End Date Taking? Authorizing Provider  cetirizine (ZYRTEC) 10 MG tablet Take 1 tablet (10 mg total) by mouth daily as needed for allergies. 10/21/17   [name], Gwyndolyn Saxon, MD  Cholecalciferol (VITAMIN D-3) 1000 units CAPS Take 1 capsule (1,000 Units total) by mouth daily. 10/22/17   [name], Gwyndolyn Saxon, MD  fluticasone (FLONASE) 50 MCG/ACT nasal spray Place 2 sprays into both nostrils daily as needed for allergies or rhinitis. 10/21/17   [name], Gwyndolyn Saxon, MD  ibuprofen (ADVIL,MOTRIN) 200 MG tablet Take by mouth every 6 (six) hours as needed.    [provider]  naproxen (NAPROSYN) 375 MG tablet Take 1 tablet (375 mg total) by mouth 2 (two) times daily with a meal. 05/08/20   Sable Feil, PA-C  rosuvastatin (CRESTOR) 5 MG tablet Take 1 tablet (5 mg total) by mouth daily. 09/23/19   Glean Hess, MD  traMADol (ULTRAM) 50 MG tablet Take 1 tablet (50 mg total) by mouth every 6 (six) hours as needed for moderate pain. 05/08/20   Sable Feil, PA-C    Allergies Patient has no known allergies.  Family History  Problem Relation Age of Onset   Delorse Limber White syndrome Mother    Heart disease Father 19       lived to 6   Uterine cancer Sister    Birth defects Paternal Grandfather    COPD Maternal Aunt     Social History  Social History   Tobacco Use   Smoking status: Never Smoker   Smokeless tobacco: Never Used  Vaping Use   Vaping Use: Never used  Substance Use Topics   Alcohol use: Yes    Comment: once monthly- rare   Drug use: No    Review of Systems Constitutional: No fever/chills Eyes: No visual changes. ENT: No sore throat. Cardiovascular: Denies chest pain. Respiratory: Denies shortness of breath. Gastrointestinal: No abdominal pain.  No nausea, no vomiting.  No diarrhea.  No constipation. Genitourinary: Negative for dysuria.  Testicle pain. Musculoskeletal: Negative for back pain. Skin: Negative for  rash. Neurological: Negative for headaches, focal weakness or numbness. Endocrine:  Hyperlipidemia  ____________________________________________   PHYSICAL EXAM:  VITAL SIGNS: ED Triage Vitals  Enc Vitals Group     BP 05/08/20 1036 (!) 150/108     Pulse Rate 05/08/20 1036 76     Resp 05/08/20 1036 20     Temp 05/08/20 1036 98.2 F (36.8 C)     Temp Source 05/08/20 1036 Oral     SpO2 05/08/20 1036 98 %     Weight 05/08/20 1034 185 lb (83.9 kg)     Height 05/08/20 1034 6' (1.829 m)     Head Circumference --      Peak Flow --      Pain Score 05/08/20 1034 0     Pain Loc --      Pain Edu? --      Excl. in [place]? --     Constitutional: Alert and oriented. Well appearing and in no acute distress. Cardiovascular: Normal rate, regular rhythm. Grossly normal heart sounds.  Good peripheral circulation.  Elevated blood pressure. Respiratory: Normal respiratory effort.  No retractions. Lungs CTAB. Gastrointestinal: Soft and nontender. No distention. No abdominal bruits. No CVA tenderness.  Mild right inguinal guarding with palpation.  No rebound. Genitourinary: No scrotal edema or erythema. Musculoskeletal: No lower extremity tenderness nor edema.  No joint effusions. Neurologic:  Normal speech and language. No gross focal neurologic deficits are appreciated. No gait instability. Skin:  Skin is warm, dry and intact. No rash noted. Psychiatric: Mood and affect are normal. Speech and behavior are normal.  ____________________________________________   LABS (all labs ordered are listed, but only abnormal results are displayed)  Labs Reviewed  URINALYSIS, COMPLETE (UACMP) WITH MICROSCOPIC - Abnormal; Notable for the following components:      Result Value   Color, Urine COLORLESS (*)    APPearance CLEAR (*)    Specific Gravity, Urine 1.003 (*)    Hgb urine dipstick SMALL (*)    All other components within normal limits    ____________________________________________  EKG   ____________________________________________  RADIOLOGY  ED MD interpretation:    Official radiology report(s): US RENAL  Result Date: 05/08/2020 CLINICAL DATA:  Right flank region pain EXAM: RENAL / URINARY TRACT ULTRASOUND COMPLETE COMPARISON:  Retroperitoneal ultrasound June 19, 2015. FINDINGS: Right Kidney: Renal measurements: 12.0 x 5.2 x 5.5 cm = volume: 175.9 mL . Echogenicity and renal cortical thickness are within normal limits. No mass, perinephric fluid, or hydronephrosis visualized. No sonographically demonstrable calculus or ureterectasis. Left Kidney: Renal measurements: 11.8 x 5.8 x 4.9 cm = volume: 174.6 mL. Echogenicity and renal cortical thickness are within normal limits. No mass, perinephric fluid, or hydronephrosis visualized. No sonographically demonstrable calculus or ureterectasis. Bladder: Appears normal for degree of bladder distention. Other: Prostate measures 4.4 x 3.6 x 6.7 cm. There are calculi within the prostate. IMPRESSION: Prostate prominent,  containing several calculi. Study otherwise unremarkable. Electronically Signed   By: Lowella Grip III M.D.   On: 05/08/2020 12:23   US SCROTUM W/DOPPLER  Result Date: 05/08/2020 CLINICAL DATA:  Right scrotal and groin pain. History of right inguinal hernia repair 2002. EXAM: SCROTAL ULTRASOUND DOPPLER ULTRASOUND OF THE TESTICLES TECHNIQUE: Complete ultrasound examination of the testicles, epididymis, and other scrotal structures was performed. Color and spectral Doppler ultrasound were also utilized to evaluate blood flow to the testicles. COMPARISON:  None. FINDINGS: Right testicle Measurements: 4.1 x 2.0 x 3.1 cm. Symmetric and homogeneous echotexture without focal lesion. Patent arterial and venous blood flow. Left testicle Measurements: 3.5 x 2.1 x 3.2 cm. Symmetric and homogeneous echotexture without focal lesion. Patent arterial and venous blood flow. Right  epididymis:  Normal in size and appearance. Left epididymis:  Normal in size and appearance. Hydrocele:  None visualized. Varicocele:  None visualized. Pulsed Doppler interrogation of both testes demonstrates normal low resistance arterial and venous waveforms bilaterally. IMPRESSION: 1. Normal sonographic appearance of both testicles. Patent blood flow. 2. No hydrocele or obvious scrotal hernia. Electronically Signed   By: Marijo Sanes M.D.   On: 05/08/2020 12:25    ____________________________________________   PROCEDURES  Procedure(s) performed (including Critical Care):  Procedures   ____________________________________________   INITIAL IMPRESSION / ASSESSMENT AND PLAN / ED COURSE  As part of my medical decision making, I reviewed the following data within the [facility]   Patient presents with right scrotum pain on increased with movement.  Patient denies dysuria or hematuria.  Patient denies urethral discharge.  Discussed ultrasound findings patient is remarkable only for prominent prostate with calculi.       Anthony Foley was evaluated in Emergency Department on 05/08/2020 for the symptoms described in the history of present illness. He was evaluated in the context of the global COVID-19 pandemic, which necessitated consideration that the patient might be at risk for infection with the SARS-CoV-2 virus that causes COVID-19. Institutional protocols and algorithms that pertain to the evaluation of patients at risk for COVID-19 are in a state of rapid change based on information released by regulatory bodies including the CDC and federal and state organizations. These policies and algorithms were followed during the patient's care in the ED.       ____________________________________________   FINAL CLINICAL IMPRESSION(S) / ED DIAGNOSES  Final diagnoses:  Right inguinal hernia  Prostatitis, unspecified prostatitis type     ED Discharge Orders          Ordered    naproxen (NAPROSYN) 375 MG tablet  2 times daily with meals     Discontinue  Reprint     05/08/20 1304    traMADol (ULTRAM) 50 MG tablet  Every 6 hours PRN     Discontinue  Reprint     05/08/20 1304           Note:  This document was prepared using Dragon voice recognition software and may include unintentional dictation errors.    Sable Feil, PA-C 05/08/20 1313    Carrie Mew, MD 05/08/20 [phone]

## 2020-05-10 NOTE — Progress Notes (Signed)
05/11/2020 10:17 AM   Anthony Foley 04-08-1951 [id number]  Referring provider: Glean Hess, MD [address] [place],  [place] [address] Chief Complaint  Patient presents with   New Patient (Initial Visit)    Prostate issues    HPI: Anthony Foley is a 69 y.o. male with prostate issues who is seen today for an evaluation and management of "prostate calcifications".  Most recent PSA was 0.9 in 09/23/2019.  The patient was presented to the ED on 05/08/2020 for right testicle pain. Right testicle pain started 4 days prior to ED visit. Patient stated pain was spread to the right groin area and into his back.  Patient denied scrotum edema or dysuria.  Patient denied urethral discharge. Patient stated pain increased with movement. He had no pain at that time. Patient was diagnosed with right inguinal strain.   Urinalysis showed a specific gravity of 1.003 and small hemoglobin urine dipstick.   Scrotal ultrasound revealed normal sonographic appearance of both testicles. Patent blood flow. There was no hydrocele or obvious scrotal hernia.  Renal ultrasound revealed prostate prominent, containing several calculi. Study otherwise unremarkable.  PVR is 39 mL today.  Symptomatically the patient feels better. He is active and does yoga.   He has more back pain in the mornings. Pain increases with positional change like bending over. He continues to have mild right testicle discomfort.  Overall improving significantly.    He notes an increase in frequency over the last 6 months. He does not have any further urinary symptoms.   He has never smoked. He has never worked in an Stage [employer].     PMH: Past Medical History:  Diagnosis Date   Hyperlipidemia    Shoulder injury     Surgical History: Past Surgical History:  Procedure Laterality Date   CHOLECYSTECTOMY  1986   COLONOSCOPY  2007   divert- [facility]   COLONOSCOPY WITH PROPOFOL N/A  10/15/2018   Procedure: COLONOSCOPY WITH PROPOFOL;  Surgeon: Lucilla Lame, MD;  Location: [facility];  Service: Endoscopy;  Laterality: N/A;   HERNIA REPAIR  2008   right inguinal   KNEE ARTHROSCOPY     POLYPECTOMY  10/15/2018   Procedure: POLYPECTOMY;  Surgeon: Lucilla Lame, MD;  Location: [facility];  Service: Endoscopy;;   SHOULDER SURGERY     TONSILLECTOMY      Home Medications:  Allergies as of 05/11/2020   No Known Allergies     Medication List       Accurate as of May 11, 2020 10:17 AM. If you have any questions, ask your nurse or doctor.        cetirizine 10 MG tablet Commonly known as: ZYRTEC Take 1 tablet (10 mg total) by mouth daily as needed for allergies.   fluticasone 50 MCG/ACT nasal spray Commonly known as: FLONASE Place 2 sprays into both nostrils daily as needed for allergies or rhinitis.   ibuprofen 200 MG tablet Commonly known as: ADVIL Take by mouth every 6 (six) hours as needed.   naproxen 375 MG tablet Commonly known as: NAPROSYN Take 1 tablet (375 mg total) by mouth 2 (two) times daily with a meal.   rosuvastatin 5 MG tablet Commonly known as: Crestor Take 1 tablet (5 mg total) by mouth daily.   traMADol 50 MG tablet Commonly known as: Ultram Take 1 tablet (50 mg total) by mouth every 6 (six) hours as needed for moderate pain.   Vitamin D-3 25 MCG (  1000 UT) Caps Take 1 capsule (1,000 Units total) by mouth daily.       Allergies: No Known Allergies  Family History: Family History  Problem Relation Age of Onset   Yves Dill Parkinson White syndrome Mother    Heart disease Father 54       lived to 59   Uterine cancer Sister    Birth defects Paternal Grandfather    COPD Maternal Aunt     Social History:  reports that he has never smoked. He has never used smokeless tobacco. He reports current alcohol use. He reports that he does not use drugs.   Physical Exam: BP (!) 144/92    Pulse 73    Ht 6' (1.829 m)     Wt 192 lb (87.1 kg)    BMI 26.04 kg/m   Constitutional:  Alert and oriented, No acute distress. HEENT: [place] AT, moist mucus membranes.  Trachea midline, no masses. Cardiovascular: No clubbing, cyanosis, or edema. Respiratory: Normal respiratory effort, no increased work of breathing. Skin: No rashes, bruises or suspicious lesions. Neurologic: Grossly intact, no focal deficits, moving all 4 extremities. Psychiatric: Normal mood and affect.   Urinalysis UA showed microscopic hematuria, RBC/HPF 6-10.  Pertinent Imaging:  Results for orders placed during the hospital encounter of 05/08/20  US RENAL  Narrative CLINICAL DATA:  Right flank region pain  EXAM: RENAL / URINARY TRACT ULTRASOUND COMPLETE  COMPARISON:  Retroperitoneal ultrasound June 19, 2015.  FINDINGS: Right Kidney:  Renal measurements: 12.0 x 5.2 x 5.5 cm = volume: 175.9 mL . Echogenicity and renal cortical thickness are within normal limits. No mass, perinephric fluid, or hydronephrosis visualized. No sonographically demonstrable calculus or ureterectasis.  Left Kidney:  Renal measurements: 11.8 x 5.8 x 4.9 cm = volume: 174.6 mL. Echogenicity and renal cortical thickness are within normal limits. No mass, perinephric fluid, or hydronephrosis visualized. No sonographically demonstrable calculus or ureterectasis.  Bladder:  Appears normal for degree of bladder distention.  Other:  Prostate measures 4.4 x 3.6 x 6.7 cm. There are calculi within the prostate.  IMPRESSION: Prostate prominent, containing several calculi.  Study otherwise unremarkable.   Electronically Signed By: Lowella Grip III M.D. On: 05/08/2020 12:23  I have personally reviewed the images and agree with radiologist interpretation.    Assessment & Plan:    1. Microscopic hematuria  Incidental  Renal ultrasound findings patient is remarkable only for prominent prostate with calculi other no upper tract pathology.  UA  today showed microscopic hematuria, RBC/HPF was 6-10.   We discussed the differential diagnosis for microscopic hematuria including nephrolithiasis, renal or upper tract tumors, bladder stones, UTIs, or bladder tumors as well as undetermined etiologies. Per AUA guidelines, I did recommend complete microscopic hematuria evaluation including CTU, possible urine cytology, and office cystoscopy.  Based on age alone, he falls into the high risk category.  Delayed imaging is recommended we discussed the risk and benefits of pursuing CT urogram.  He understands that the additional information provided by the study would be to assess his ureters for upper tract neoplasm.  That being said, he is fairly low risk for this and the overall incidence is also quite low.  We discussed risks and benefits.  He does not know if he wants to pursue this due to concern for cost, etc.  He will let us know if he would like to pursue this.  He is interested and willing and able to pursue cystoscopy.  2. Right groin pain Work up and  imaging were unremarkable, no further imaging at this time. Differ from exam today due to improving symptoms Will continue supportive care Advised patient to perform light stretching and yoga until no further pain   3. Prostate calcification Incidental radiological finding, clinically insignificant and no further work up is needed at this time. Minimal voiding symptoms PSA within normal limits  Return for [facility] [address], [place], [place] [address] [phone]  I, Selena Batten, am acting as a scribe for Dr. Hollice Espy.  I have reviewed the above documentation for accuracy and completeness, and I agree with the above.   Hollice Espy, MD

## 2020-05-11 ENCOUNTER — Other Ambulatory Visit: Payer: Self-pay

## 2020-05-11 ENCOUNTER — Encounter: Payer: Self-pay | Admitting: Urology

## 2020-05-11 ENCOUNTER — Ambulatory Visit: Payer: PPO | Admitting: Urology

## 2020-05-11 ENCOUNTER — Other Ambulatory Visit
Admission: RE | Admit: 2020-05-11 | Discharge: 2020-05-11 | Disposition: A | Discharge status: Home / Self Care | Payer: PPO | Attending: Urology | Admitting: Urology

## 2020-05-11 VITALS — BP 144/92 | HR 73 | Ht 72.0 in | Wt 192.0 lb

## 2020-05-11 DIAGNOSIS — K4090 Unilateral inguinal hernia, without obstruction or gangrene, not specified as recurrent: Secondary | ICD-10-CM

## 2020-05-11 LAB — URINALYSIS, COMPLETE (UACMP) WITH MICROSCOPIC
Bacteria, UA: NONE SEEN
Bilirubin Urine: NEGATIVE
Glucose, UA: NEGATIVE mg/dL
Ketones, ur: NEGATIVE mg/dL
Leukocytes,Ua: NEGATIVE
Nitrite: NEGATIVE
Protein, ur: NEGATIVE mg/dL
Specific Gravity, Urine: 1.025 (ref 1.005–1.030)
pH: 7 (ref 5.0–8.0)

## 2020-05-11 LAB — BLADDER SCAN AMB NON-IMAGING

## 2020-05-14 ENCOUNTER — Ambulatory Visit: Payer: PPO | Admitting: Internal Medicine

## 2020-06-07 ENCOUNTER — Other Ambulatory Visit: Payer: Self-pay | Admitting: *Deleted

## 2020-06-07 DIAGNOSIS — K4090 Unilateral inguinal hernia, without obstruction or gangrene, not specified as recurrent: Secondary | ICD-10-CM

## 2020-06-07 NOTE — Progress Notes (Signed)
   06/08/2020  CC:  Chief Complaint  Patient presents with  . Cysto    HPI: Anthony Foley is a 69 y.o. male with microscopic hematuria, prostate calcification, and right groin pain returns today for a cystoscopy.   PSA was 0.9 as of 09/23/2019.   The patient was presented to the ED on 05/08/2020 for right testicle pain. Right testicle pain started 4 days prior to ED visit. Patient stated pain was spread to the right groin area and into his back. Patient denied scrotum edema or dysuria. Patient denied urethral discharge. Patient stated pain increased with movement. He had no pain at that time. Patient was diagnosed with right inguinal strain.   Urinalysis showed a specific gravity of 1.003 and small hemoglobin urine dipstick.   Scrotal ultrasound revealed normal sonographic appearance of both testicles. Patent blood flow. There was no hydrocele or obvious scrotal hernia.  Renal ultrasound revealed prostate prominent, containing several calculi. Study otherwise unremarkable.   Blood pressure 134/89, pulse 71, height 6' (1.829 m), weight 187 lb (84.8 kg). NED. A&Ox3.   No respiratory distress   Abd soft, NT, ND Normal phallus with bilateral descended testicles  Cystoscopy Procedure Note  Patient identification was confirmed, informed consent was obtained, and patient was prepped using Betadine solution.  Lidocaine jelly was administered per urethral meatus.     Pre-Procedure: - Inspection reveals a normal caliber ureteral meatus.  Procedure: The flexible cystoscope was introduced without difficulty - Normal urethra calibration  - There was a small stricture bulbar urethra that I could not get by with scope and there was a tiny mucosal raised flap. I attempted to use a probe with wire but this was unsuccessful therefore cystoscopy was aborted.      Assessment/ Plan:  1. Microscopic hematuria UA is negative. Unable to complete cystoscopy see details above. Will  re-attempt at the [facility] site in case the patient needs a urethral dilation.  2. Small stricture bulbar  Incidental finding-unable to pass scope Asymptomatic He was able to void after today's procedure with this tiny clot, will attempt again in the office in [place] we have access to additional equipment including dilators, wires, balloons, etc. Retention precautions reviewed as well as UTI precautions Received periprocedural antibiotics today -Keflex 100 mg Rx sent.  3. Right groin pain Persistent but improving Continue supportive care    I, Selena Batten, am acting as a scribe for Dr. Hollice Espy.  I have reviewed the above documentation for accuracy and completeness, and I agree with the above.   Hollice Espy, MD

## 2020-06-08 ENCOUNTER — Encounter: Payer: Self-pay | Admitting: Urology

## 2020-06-08 ENCOUNTER — Other Ambulatory Visit
Admission: RE | Admit: 2020-06-08 | Discharge: 2020-06-08 | Disposition: A | Discharge status: Home / Self Care | Payer: PPO | Attending: Urology | Admitting: Urology

## 2020-06-08 ENCOUNTER — Ambulatory Visit: Payer: PPO | Admitting: Urology

## 2020-06-08 ENCOUNTER — Other Ambulatory Visit: Payer: Self-pay

## 2020-06-08 VITALS — BP 134/89 | HR 71 | Ht 72.0 in | Wt 187.0 lb

## 2020-06-08 DIAGNOSIS — R3129 Other microscopic hematuria: Secondary | ICD-10-CM

## 2020-06-08 DIAGNOSIS — N35912 Unspecified bulbous urethral stricture, male: Secondary | ICD-10-CM

## 2020-06-08 DIAGNOSIS — K4090 Unilateral inguinal hernia, without obstruction or gangrene, not specified as recurrent: Secondary | ICD-10-CM | POA: Insufficient documentation

## 2020-06-08 LAB — URINALYSIS, COMPLETE (UACMP) WITH MICROSCOPIC
Bacteria, UA: NONE SEEN
Bilirubin Urine: NEGATIVE
Glucose, UA: NEGATIVE mg/dL
Ketones, ur: NEGATIVE mg/dL
Leukocytes,Ua: NEGATIVE
Nitrite: NEGATIVE
Protein, ur: NEGATIVE mg/dL
Specific Gravity, Urine: 1.025 (ref 1.005–1.030)
pH: 5 (ref 5.0–8.0)

## 2020-07-12 ENCOUNTER — Other Ambulatory Visit: Payer: PPO | Admitting: Urology

## 2020-07-13 ENCOUNTER — Encounter: Payer: Self-pay | Admitting: Urology

## 2020-08-02 ENCOUNTER — Other Ambulatory Visit: Payer: Self-pay

## 2020-08-02 ENCOUNTER — Ambulatory Visit (INDEPENDENT_AMBULATORY_CARE_PROVIDER_SITE_OTHER): Payer: PPO | Admitting: Urology

## 2020-08-02 ENCOUNTER — Encounter: Payer: Self-pay | Admitting: Urology

## 2020-08-02 VITALS — BP 164/99 | HR 84 | Ht 72.0 in | Wt 187.0 lb

## 2020-08-02 DIAGNOSIS — R3129 Other microscopic hematuria: Secondary | ICD-10-CM

## 2020-08-02 DIAGNOSIS — N35912 Unspecified bulbous urethral stricture, male: Secondary | ICD-10-CM

## 2020-08-02 LAB — URINALYSIS, COMPLETE
Bilirubin, UA: NEGATIVE
Glucose, UA: NEGATIVE
Leukocytes,UA: NEGATIVE
Nitrite, UA: NEGATIVE
Protein,UA: NEGATIVE
Specific Gravity, UA: 1.025 (ref 1.005–1.030)
Urobilinogen, Ur: 0.2 mg/dL (ref 0.2–1.0)
pH, UA: 5.5 (ref 5.0–7.5)

## 2020-08-02 LAB — MICROSCOPIC EXAMINATION: Bacteria, UA: NONE SEEN

## 2020-08-02 NOTE — Progress Notes (Signed)
   08/02/20  CC:  Chief Complaint  Patient presents with  . Cysto    HPI: 69 year old male who initially presented with groin pain which is since resolved found to have incidental microscopic hematuria. He opted for renal ultrasound and lieu of CT urogram and cystoscopy. Unfortunately, he had a slight bulbar urethral stricture precluding previous cystoscopy attempt. He presents today for a second attempt.  He notes today that his been voiding well without difficulty since then. No gross hematuria. No dysuria.  UA today with 3-10 red blood cells per high-powered field, otherwise unremarkable.  Blood pressure (!) 164/99, pulse 84, height 6' (1.829 m), weight 187 lb (84.8 kg). NED. A&Ox3.   No respiratory distress   Abd soft, NT, ND Normal phallus with bilateral descended testicles  Cystoscopy Procedure Note  Patient identification was confirmed, informed consent was obtained, and patient was prepped using Betadine solution.  Lidocaine jelly was administered per urethral meatus.     Pre-Procedure: - Inspection reveals a normal caliber ureteral meatus.  Procedure: The flexible cystoscope was introduced without difficulty -Very subtle bulbar urethral stricture just distal to the sphincter, appears to be approximately 16 [place] in diameter. As a precaution, I advanced a sensor wire through the stricture on is able to push past the stricture with minimal effort. - Enlarged prostate with bilobar coaptation - Normal bladder neck - Bilateral ureteral orifices identified - Bladder mucosa  reveals no ulcers, tumors, or lesions - No bladder stones - No trabeculation  Retroflexion shows slight hyperemia along the bladder neck but otherwise no median lobe   Post-Procedure: - Patient tolerated the procedure well  Assessment/ Plan:  1. Microscopic hematuria Renal ultrasound unremarkable  Cystoscopy today was also unremarkable other than #2  He would like to defer CT urogram, given  his relatively low risk and minimal hematuria, agreeable with this plan. He understands the possibility of missed diagnosis albeit quite low. - Urinalysis, Complete  2. 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 This was passable today with the cystoscope and is otherwise asymptomatic  Would recommend no further intervention unless he starts to have obstructive voiding symptoms.    Hollice Espy, MD

## 2020-08-20 DIAGNOSIS — M170 Bilateral primary osteoarthritis of knee: Secondary | ICD-10-CM | POA: Diagnosis not present

## 2020-09-17 ENCOUNTER — Ambulatory Visit: Payer: PPO

## 2020-09-21 ENCOUNTER — Other Ambulatory Visit: Payer: Self-pay | Admitting: Internal Medicine

## 2020-09-21 DIAGNOSIS — E782 Mixed hyperlipidemia: Secondary | ICD-10-CM

## 2020-09-21 NOTE — Telephone Encounter (Signed)
Attempted to call patient to schedule annual exam- left message on VM to call office- courtesy RF given #[phone]

## 2020-09-24 ENCOUNTER — Ambulatory Visit (INDEPENDENT_AMBULATORY_CARE_PROVIDER_SITE_OTHER): Payer: PPO | Admitting: Internal Medicine

## 2020-09-24 ENCOUNTER — Other Ambulatory Visit: Payer: Self-pay

## 2020-09-24 ENCOUNTER — Encounter: Payer: Self-pay | Admitting: Internal Medicine

## 2020-09-24 VITALS — BP 122/78 | HR 64 | Temp 97.4°F | Ht 72.0 in | Wt 194.0 lb

## 2020-09-24 DIAGNOSIS — M170 Bilateral primary osteoarthritis of knee: Secondary | ICD-10-CM | POA: Diagnosis not present

## 2020-09-24 DIAGNOSIS — Z0000 Encounter for general adult medical examination without abnormal findings: Secondary | ICD-10-CM

## 2020-09-24 DIAGNOSIS — E782 Mixed hyperlipidemia: Secondary | ICD-10-CM | POA: Diagnosis not present

## 2020-09-24 DIAGNOSIS — R311 Benign essential microscopic hematuria: Secondary | ICD-10-CM | POA: Insufficient documentation

## 2020-09-24 DIAGNOSIS — Z125 Encounter for screening for malignant neoplasm of prostate: Secondary | ICD-10-CM | POA: Diagnosis not present

## 2020-09-24 DIAGNOSIS — E559 Vitamin D deficiency, unspecified: Secondary | ICD-10-CM

## 2020-09-24 MED ORDER — ROSUVASTATIN CALCIUM 5 MG PO TABS: 5.0000 mg | ORAL_TABLET | Freq: Every day | ORAL | 3 refills | Status: DC

## 2020-09-24 NOTE — Progress Notes (Signed)
Date:  09/24/2020   Name:  Anthony Foley   DOB:  1951-01-20   MRN:  [id number]   Chief Complaint: Annual Exam  Anthony Foley is a 69 y.o. male who presents today for his Complete Annual Exam. He feels well. He reports exercising - 5 x weekly. He reports he is sleeping well.   Colonoscopy: 10/2018 repeat 2024  Immunization History  Administered Date(s) Administered  . Influenza, High Dose Seasonal PF 10/21/2017, 09/21/2018, 08/26/2019  . Influenza,inj,Quad PF,6+ Mos 09/03/2015, 09/04/2016  . Influenza-Unspecified 08/14/2020  . Pneumococcal Conjugate-13 10/21/2017  . Pneumococcal Polysaccharide-23 11/22/2018  . Tdap 10/15/2016  . Zoster Recombinat (Shingrix) 08/26/2019    Hyperlipidemia The problem is controlled. Pertinent negatives include no chest pain, myalgias or shortness of breath. Current antihyperlipidemic treatment includes statins. The current treatment provides significant improvement of lipids.  Knee Pain  There was no injury mechanism. The pain is mild. He reports no foreign bodies present. He has tried NSAIDs (advil 800 mg bid; followed by Ortho) for the symptoms.    Lab Results  Component Value Date   CREATININE 1.13 09/23/2019   BUN 26 09/23/2019   NA 142 09/23/2019   K 4.4 09/23/2019   CL 104 09/23/2019   CO2 22 09/23/2019   Lab Results  Component Value Date   CHOL 163 09/23/2019   HDL 58 09/23/2019   LDLCALC 90 09/23/2019   TRIG 78 09/23/2019   CHOLHDL 2.8 09/23/2019   No results found for: TSH No results found for: HGBA1C Lab Results  Component Value Date   WBC 5.4 09/23/2019   HGB 14.8 09/23/2019   HCT 44.8 09/23/2019   MCV 92 09/23/2019   PLT 226 09/23/2019   Lab Results  Component Value Date   ALT 17 09/23/2019   AST 16 09/23/2019   ALKPHOS 89 09/23/2019   BILITOT 0.6 09/23/2019     Review of Systems  Constitutional: Negative for appetite change, chills, diaphoresis, fatigue and unexpected weight change.  HENT:  Negative for hearing loss, tinnitus, trouble swallowing and voice change.   Eyes: Negative for visual disturbance.  Respiratory: Negative for choking, shortness of breath and wheezing.   Cardiovascular: Negative for chest pain, palpitations and leg swelling.  Gastrointestinal: Negative for abdominal pain, blood in stool, constipation and diarrhea.  Genitourinary: Negative for difficulty urinating, dysuria and frequency.  Musculoskeletal: Positive for arthralgias (bilateral knee OA). Negative for back pain and myalgias.  Skin: Negative for color change and rash.  Neurological: Negative for dizziness, syncope and headaches.  Hematological: Negative for adenopathy.  Psychiatric/Behavioral: Negative for dysphoric mood and sleep disturbance.    Patient Active Problem List   Diagnosis Date Noted  . Encounter for screening colonoscopy   . Benign neoplasm of transverse colon   . Pain of left hip joint 09/21/2018  . Partial tear of left rotator cuff 04/21/2017  . Nontraumatic rupture of long head of biceps tendon 04/21/2017  . Osteoarthritis of knee 04/21/2017  . Allergic rhinitis 04/21/2017  . Vitamin D insufficiency 06/25/2015  . FH: abdominal aortic aneurysm 06/08/2015  . Overweight (BMI 25.0-29.9) 06/08/2015  . Mixed hyperlipidemia 06/08/2015    No Known Allergies  Past Surgical History:  Procedure Laterality Date  . CHOLECYSTECTOMY  1986  . COLONOSCOPY  2007   divert- [facility]  . COLONOSCOPY WITH PROPOFOL N/A 10/15/2018   Procedure: COLONOSCOPY WITH PROPOFOL;  Surgeon: Lucilla Lame, MD;  Location: [facility];  Service: Endoscopy;  Laterality: N/A;  . HERNIA  REPAIR  2008   right inguinal  . KNEE ARTHROSCOPY    . POLYPECTOMY  10/15/2018   Procedure: POLYPECTOMY;  Surgeon: Lucilla Lame, MD;  Location: [facility];  Service: Endoscopy;;  . SHOULDER SURGERY    . TONSILLECTOMY      Social History   Tobacco Use  . Smoking status: Never Smoker  . Smokeless  tobacco: Never Used  Vaping Use  . Vaping Use: Never used  Substance Use Topics  . Alcohol use: Yes    Comment: once monthly- rare  . Drug use: No     Medication list has been reviewed and updated.  Current Meds  Medication Sig  . cetirizine (ZYRTEC) 10 MG tablet Take 1 tablet (10 mg total) by mouth daily as needed for allergies.  . Cholecalciferol (VITAMIN D-3) 1000 units CAPS Take 1 capsule (1,000 Units total) by mouth daily.  Marland Kitchen ibuprofen (ADVIL,MOTRIN) 200 MG tablet Take by mouth every 6 (six) hours as needed.  . rosuvastatin (CRESTOR) 5 MG tablet TAKE 1 TABLET BY MOUTH EVERY DAY    PHQ 2/9 Scores 09/24/2020 09/23/2019 09/14/2019 03/22/2019  PHQ - 2 Score 0 0 0 0  PHQ- 9 Score 0 - - -    GAD 7 : Generalized Anxiety Score 09/24/2020  Nervous, Anxious, on Edge 0  Control/stop worrying 0  Worry too much - different things 0  Trouble relaxing 0  Restless 0  Easily annoyed or irritable 0  Afraid - awful might happen 0  Total GAD 7 Score 0  Anxiety Difficulty Not difficult at all    BP Readings from Last 3 Encounters:  09/24/20 122/78  08/02/20 (!) 164/99  06/08/20 134/89    Physical Exam Vitals and nursing note reviewed.  Constitutional:      Appearance: Normal appearance. He is well-developed.  HENT:     Head: Normocephalic.     Right Ear: Tympanic membrane, ear canal and external ear normal.     Left Ear: Tympanic membrane, ear canal and external ear normal.     Nose: Nose normal.  Eyes:     Conjunctiva/sclera: Conjunctivae normal.     Pupils: Pupils are equal, round, and reactive to light.  Neck:     Thyroid: No thyromegaly.     Vascular: No carotid bruit.  Cardiovascular:     Rate and Rhythm: Normal rate and regular rhythm.     Pulses: Normal pulses.     Heart sounds: Normal heart sounds.  Pulmonary:     Effort: Pulmonary effort is normal.     Breath sounds: Normal breath sounds. No wheezing.  Chest:     Breasts:        Right: No mass.        Left:  No mass.  Abdominal:     General: Abdomen is flat. Bowel sounds are normal.     Palpations: Abdomen is soft.     Tenderness: There is no abdominal tenderness.  Musculoskeletal:     Cervical back: Normal range of motion and neck supple.     Right lower leg: No edema.     Left lower leg: No edema.  Lymphadenopathy:     Cervical: No cervical adenopathy.  Skin:    General: Skin is warm and dry.     Capillary Refill: Capillary refill takes less than 2 seconds.          Comments: 1 cm smooth soft mobile subcut mass c/w cyst  Neurological:     General: No  focal deficit present.     Mental Status: He is alert and oriented to person, place, and time.     Deep Tendon Reflexes: Reflexes are normal and symmetric.  Psychiatric:        Attention and Perception: Attention normal.        Mood and Affect: Mood normal.        Speech: Speech normal.        Behavior: Behavior normal.     Wt Readings from Last 3 Encounters:  09/24/20 194 lb (88 kg)  08/02/20 187 lb (84.8 kg)  06/08/20 187 lb (84.8 kg)    BP 122/78   Pulse 64   Temp (!) 97.4 F (36.3 C) (Oral)   Ht 6' (1.829 m)   Wt 194 lb (88 kg)   SpO2 100%   BMI 26.31 kg/m   Assessment and Plan: 1. Annual physical exam Normal exam Continue healthy diet, regular exercise  2. Prostate cancer screening DRE deferred - PSA  3. Mixed hyperlipidemia On statin therapy without side effect or other concerns - Comprehensive metabolic panel - Lipid panel - rosuvastatin (CRESTOR) 5 MG tablet; Take 1 tablet (5 mg total) by mouth daily.  Dispense: 90 tablet; Refill: 3  4. Benign microscopic hematuria Evaluation completed by Urology  5. Vitamin D insufficiency Continue daily supplement - will advise if higher dose is needed - VITAMIN D 25 Hydroxy (Vit-D Deficiency, Fractures)  6. Primary osteoarthritis of both knees Continue exercise as tolerated Advil 800 mg bid PRN  Partially dictated using Editor, commissioning. Any errors are  unintentional.  Halina Maidens, MD [facility]  09/24/2020

## 2020-09-25 LAB — COMPREHENSIVE METABOLIC PANEL
ALT: 15 IU/L (ref 0–44)
AST: 18 IU/L (ref 0–40)
Albumin/Globulin Ratio: 1.9 (ref 1.2–2.2)
Albumin: 4.7 g/dL (ref 3.8–4.8)
Alkaline Phosphatase: 89 IU/L (ref 44–121)
BUN/Creatinine Ratio: 14 (ref 10–24)
BUN: 17 mg/dL (ref 8–27)
Bilirubin Total: 0.7 mg/dL (ref 0.0–1.2)
CO2: 24 mmol/L (ref 20–29)
Calcium: 9.8 mg/dL (ref 8.6–10.2)
Chloride: 103 mmol/L (ref 96–106)
Creatinine, Ser: 1.18 mg/dL (ref 0.76–1.27)
GFR calc Af Amer: 73 mL/min/{1.73_m2} (ref >59)
GFR calc non Af Amer: 63 mL/min/{1.73_m2} (ref >59)
Globulin, Total: 2.5 g/dL (ref 1.5–4.5)
Glucose: 86 mg/dL (ref 65–99)
Potassium: 4.5 mmol/L (ref 3.5–5.2)
Sodium: 143 mmol/L (ref 134–144)
Total Protein: 7.2 g/dL (ref 6.0–8.5)

## 2020-09-25 LAB — VITAMIN D 25 HYDROXY (VIT D DEFICIENCY, FRACTURES): Vit D, 25-Hydroxy: 35 ng/mL (ref 30.0–100.0)

## 2020-09-25 LAB — PSA: Prostate Specific Ag, Serum: 1.1 ng/mL (ref 0.0–4.0)

## 2020-09-25 LAB — LIPID PANEL
Chol/HDL Ratio: 3 ratio (ref 0.0–5.0)
Cholesterol, Total: 167 mg/dL (ref 100–199)
HDL: 56 mg/dL (ref >39)
LDL Chol Calc (NIH): 87 mg/dL (ref 0–99)
Triglycerides: 139 mg/dL (ref 0–149)
VLDL Cholesterol Cal: 24 mg/dL (ref 5–40)

## 2020-12-17 ENCOUNTER — Ambulatory Visit (INDEPENDENT_AMBULATORY_CARE_PROVIDER_SITE_OTHER): Payer: PPO

## 2020-12-17 DIAGNOSIS — Z0000 Encounter for general adult medical examination without abnormal findings: Secondary | ICD-10-CM | POA: Diagnosis not present

## 2020-12-17 NOTE — Progress Notes (Signed)
Subjective:   Anthony Foley is a 70 y.o. male who presents for Medicare Annual/Subsequent preventive examination.  Virtual Visit via Telephone Note  I connected with  Anthony Foley on 12/17/20 at  8:00 AM EST by telephone and verified that I am speaking with the correct person using two identifiers.  Location: Patient: home Provider: [facility] Persons participating in the virtual visit: [place]   I discussed the limitations, risks, security and privacy concerns of performing an evaluation and management service by telephone and the availability of in person appointments. The patient expressed understanding and agreed to proceed.  Interactive audio and video telecommunications were attempted between this nurse and patient, however failed, due to patient having technical difficulties OR patient did not have access to video capability.  We continued and completed visit with audio only.  Some vital signs may be absent or patient reported.   Anthony Marker, LPN    Review of Systems     Cardiac Risk Factors include: advanced age (>29men, >75 women);dyslipidemia;male gender     Objective:    There were no vitals filed for this visit. There is no height or weight on file to calculate BMI.  Advanced Directives 12/17/2020 05/08/2020 09/14/2019 10/15/2018 04/17/2017 10/15/2016 06/08/2015  Does Patient Have a Medical Advance Directive? No No No No No No No  Would patient like information on creating a medical advance directive? No - Patient declined No - Patient declined Yes (MAU/Ambulatory/Procedural Areas - Information given) No - Patient declined No - Patient declined - No - patient declined information    Current Medications (verified) Outpatient Encounter Medications as of 12/17/2020  Medication Sig  . cetirizine (ZYRTEC) 10 MG tablet Take 1 tablet (10 mg total) by mouth daily as needed for allergies.  . Cholecalciferol (VITAMIN D-3) 1000 units CAPS Take 1 capsule  (1,000 Units total) by mouth daily.  Marland Kitchen ibuprofen (ADVIL,MOTRIN) 200 MG tablet Take by mouth every 6 (six) hours as needed.  . rosuvastatin (CRESTOR) 5 MG tablet Take 1 tablet (5 mg total) by mouth daily.   No facility-administered encounter medications on file as of 12/17/2020.    Allergies (verified) Patient has no known allergies.   History: Past Medical History:  Diagnosis Date  . Hyperlipidemia   . Shoulder injury    Past Surgical History:  Procedure Laterality Date  . CHOLECYSTECTOMY  1986  . COLONOSCOPY  2007   divert- [place] [facility]  . COLONOSCOPY WITH PROPOFOL N/A 10/15/2018   Procedure: COLONOSCOPY WITH PROPOFOL;  Surgeon: Lucilla Lame, MD;  Location: [place];  Service: Endoscopy;  Laterality: N/A;  . HERNIA REPAIR  2008   right inguinal  . KNEE ARTHROSCOPY    . POLYPECTOMY  10/15/2018   Procedure: POLYPECTOMY;  Surgeon: Lucilla Lame, MD;  Location: [place];  Service: Endoscopy;;  . SHOULDER SURGERY    . TONSILLECTOMY     Family History  Problem Relation Age of Onset  . [place] syndrome Mother   . Heart disease Father 7       lived to 28  . Uterine cancer Sister   . Birth defects Paternal Grandfather   . COPD Maternal Aunt    Social History   Socioeconomic History  . Marital status: Married    Spouse name: Not on file  . Number of children: Not on file  . Years of education: Not on file  . Highest education level: Not on file  Occupational History  . Not on  file  Tobacco Use  . Smoking status: Never Smoker  . Smokeless tobacco: Never Used  Vaping Use  . Vaping Use: Never used  Substance and Sexual Activity  . Alcohol use: Not Currently    Alcohol/week: 0.0 standard drinks    Comment: once monthly- rare  . Drug use: No  . Sexual activity: Yes  Other Topics Concern  . Not on file  Social History Narrative  . Not on file   Social Determinants of Health   Financial Resource Strain: Low Risk   . Difficulty of  Paying Living Expenses: Not hard at all  Food Insecurity: No Food Insecurity  . Worried About Charity fundraiser in the Last Year: Never true  . Ran Out of Food in the Last Year: Never true  Transportation Needs: No Transportation Needs  . Lack of Transportation (Medical): No  . Lack of Transportation (Non-Medical): No  Physical Activity: Sufficiently Active  . Days of Exercise per Week: 5 days  . Minutes of Exercise per Session: 30 min  Stress: No Stress Concern Present  . Feeling of Stress : Not at all  Social Connections: Moderately Integrated  . Frequency of Communication with Friends and Family: More than three times a week  . Frequency of Social Gatherings with Friends and Family: Three times a week  . Attends Religious Services: More than 4 times per year  . Active Member of Clubs or Organizations: No  . Attends Archivist Meetings: Never  . Marital Status: Married    Tobacco Counseling Counseling given: Not Answered   Clinical Intake:  Pre-visit preparation completed: Yes  Pain : No/denies pain     Nutritional Risks: None Diabetes: No  How often do you need to have someone help you when you read instructions, pamphlets, or other written materials from your doctor or pharmacy?: 1 - Never    Interpreter Needed?: No  Information entered by :: Anthony Marker LPN   Activities of Daily Living In your present state of health, do you have any difficulty performing the following activities: 12/17/2020 09/24/2020  Hearing? N N  Comment declines hearing aids -  Vision? N N  Difficulty concentrating or making decisions? N N  Walking or climbing stairs? N N  Dressing or bathing? N N  Doing errands, shopping? N N  Preparing Food and eating ? N -  Using the Toilet? N -  In the past six months, have you accidently leaked urine? N -  Do you have problems with loss of bowel control? N -  Managing your Medications? N -  Managing your Finances? N -  Housekeeping  or managing your Housekeeping? N -  Some recent data might be hidden    Patient Care Team: Glean Hess, MD as PCP - General (Internal Medicine) Jannet Mantis, MD (Dermatology)  Indicate any recent Medical Services you may have received from other than [facility] providers in the past year (date may be approximate).     Assessment:   This is a routine wellness examination for Anthony Foley.  Hearing/Vision screen  Hearing Screening   125Hz  250Hz  500Hz  1000Hz  2000Hz  3000Hz  4000Hz  6000Hz  8000Hz   Right ear:           Left ear:           Comments: Pt denies hearing difficulty   Vision Screening Comments: Annual vision screenings previously done by Dr. Wyatt Portela; due for exam and may need to establish with new provider due to insurance  Dietary issues and exercise activities discussed: Current Exercise Habits: Home exercise routine, Type of exercise: Other - see comments (elliptical), Time (Minutes): 30, Frequency (Times/Week): 5, Weekly Exercise (Minutes/Week): 150, Intensity: Moderate, Exercise limited by: None identified  Goals    . Weight (lb) < 175 lb (79.4 kg)     Pt would like to lose weight over the next year with healthy eating and physical activity      Depression Screen PHQ 2/9 Scores 12/17/2020 09/24/2020 09/23/2019 09/14/2019 03/22/2019 10/21/2017 06/08/2015  PHQ - 2 Score 0 0 0 0 0 0 0  PHQ- 9 Score - 0 - - - - -    Fall Risk Fall Risk  12/17/2020 09/24/2020 09/23/2019 09/14/2019 03/22/2019  Falls in the past year? 0 0 0 0 0  Number falls in past yr: 0 0 0 0 0  Injury with Fall? 0 0 0 0 0  Risk for fall due to : No Fall Risks - - - -  Follow up Falls prevention discussed Falls evaluation completed - Falls prevention discussed Falls evaluation completed    FALL RISK PREVENTION PERTAINING TO THE HOME:  Any stairs in or around the home? Yes  If so, are there any without handrails? No  Home free of loose throw rugs in walkways, pet beds, electrical cords, etc? Yes   Adequate lighting in your home to reduce risk of falls? Yes   ASSISTIVE DEVICES UTILIZED TO PREVENT FALLS:  Life alert? No  Use of a cane, walker or w/c? No  Grab bars in the bathroom? No  Shower chair or bench in shower? Yes  Elevated toilet seat or a handicapped toilet? No   TIMED UP AND GO:  Was the test performed? No . Telephonic visit.   Cognitive Function: Normal cognitive status assessed by direct observation by this Nurse Health Advisor. No abnormalities found.       6CIT Screen 09/14/2019  What Year? 0 points  What month? 0 points  What time? 0 points  Count back from 20 0 points  Months in reverse 0 points  Repeat phrase 0 points  Total Score 0    Immunizations Immunization History  Administered Date(s) Administered  . Influenza, High Dose Seasonal PF 10/21/2017, 09/21/2018, 08/26/2019  . Influenza,inj,Quad PF,6+ Mos 09/03/2015, 09/04/2016  . Influenza-Unspecified 08/14/2020  . PFIZER(Purple Top)SARS-COV-2 Vaccination 12/13/2019, 01/03/2020, 08/14/2020  . Pneumococcal Conjugate-13 10/21/2017  . Pneumococcal Polysaccharide-23 11/22/2018  . Tdap 10/15/2016  . Zoster Recombinat (Shingrix) 08/26/2019    TDAP status: Up to date  Flu Vaccine status: Up to date  Pneumococcal vaccine status: Up to date  Covid-19 vaccine status: Completed vaccines  Qualifies for Shingles Vaccine? Yes   Zostavax completed No   Shingrix Completed?: Yes  Screening Tests Health Maintenance  Topic Date Due  . COLONOSCOPY (Pts 45-72yrs Insurance coverage will need to be confirmed)  10/16/2023  . TETANUS/TDAP  10/15/2026  . INFLUENZA VACCINE  Completed  . COVID-19 Vaccine  Completed  . Hepatitis C Screening  Completed  . PNA vac Low Risk Adult  Completed    Health Maintenance  There are no preventive care reminders to display for this patient.  Colorectal cancer screening: Type of screening: Colonoscopy. Completed 10/15/18. Repeat every 5 years  Lung Cancer Screening:  (Low Dose CT Chest recommended if Age 97-80 years, 30 pack-year currently smoking OR have quit w/in 15years.) does not qualify.   Additional Screening:  Hepatitis C Screening: does qualify; Completed 10/21/17  Vision Screening: Recommended annual ophthalmology exams  for early detection of glaucoma and other disorders of the eye. Is the patient up to date with their annual eye exam?  No  Who is the provider or what is the name of the office in which the patient attends annual eye exams? Dr. Wyatt Portela  Dental Screening: Recommended annual dental exams for proper oral hygiene  Community Resource Referral / Chronic Care Management: CRR required this visit?  No   CCM required this visit?  No      Plan:     I have personally reviewed and noted the following in the patient's chart:   . Medical and social history . Use of alcohol, tobacco or illicit drugs  . Current medications and supplements . Functional ability and status . Nutritional status . Physical activity . Advanced directives . List of other physicians . Hospitalizations, surgeries, and ER visits in previous 12 months . Vitals . Screenings to include cognitive, depression, and falls . Referrals and appointments  In addition, I have reviewed and discussed with patient certain preventive protocols, quality metrics, and best practice recommendations. A written personalized care plan for preventive services as well as general preventive health recommendations were provided to patient.     Anthony Marker, LPN   [id number]/77/[id number]   Nurse Notes: none

## 2020-12-17 NOTE — Patient Instructions (Signed)
Mr. Anthony Foley , Thank you for taking time to come for your Medicare Wellness Visit. I appreciate your ongoing commitment to your health goals. Please review the following plan we discussed and let me know if I can assist you in the future.   Screening recommendations/referrals: Colonoscopy: done 10/15/18. Repeat in 2024 Recommended yearly ophthalmology/optometry visit for glaucoma screening and checkup Recommended yearly dental visit for hygiene and checkup  Vaccinations: Influenza vaccine: done 08/14/20 Pneumococcal vaccine: done 11/22/18 Tdap vaccine: done 10/15/16 Shingles vaccine: done 08/26/19; we will contact [facility] for the second vaccination date Covid-19: done 12/13/19, 01/03/20 & 08/14/20  Advanced directives: Please bring a copy of your health care power of attorney and living will to the office at your convenience once you have completed those documents.   Conditions/risks identified: Keep up the great work!  Next appointment: Follow up in one year for your annual wellness visit.   Preventive Care 69 Years and Older, Male Preventive care refers to lifestyle choices and visits with your health care provider that can promote health and wellness. What does preventive care include?  A yearly physical exam. This is also called an annual well check.  Dental exams once or twice a year.  Routine eye exams. Ask your health care provider how often you should have your eyes checked.  Personal lifestyle choices, including:  Daily care of your teeth and gums.  Regular physical activity.  Eating a healthy diet.  Avoiding tobacco and drug use.  Limiting alcohol use.  Practicing safe sex.  Taking low doses of aspirin every day.  Taking vitamin and mineral supplements as recommended by your health care provider. What happens during an annual well check? The services and screenings done by your health care provider during your annual well check will depend on your age, overall  health, lifestyle risk factors, and family history of disease. Counseling  Your health care provider may ask you questions about your:  Alcohol use.  Tobacco use.  Drug use.  Emotional well-being.  Home and relationship well-being.  Sexual activity.  Eating habits.  History of falls.  Memory and ability to understand (cognition).  Work and work Statistician. Screening  You may have the following tests or measurements:  Height, weight, and BMI.  Blood pressure.  Lipid and cholesterol levels. These may be checked every 5 years, or more frequently if you are over 74 years old.  Skin check.  Lung cancer screening. You may have this screening every year starting at age 37 if you have a 30-pack-year history of smoking and currently smoke or have quit within the past 15 years.  Fecal occult blood test (FOBT) of the stool. You may have this test every year starting at age 26.  Flexible sigmoidoscopy or colonoscopy. You may have a sigmoidoscopy every 5 years or a colonoscopy every 10 years starting at age 52.  Prostate cancer screening. Recommendations will vary depending on your family history and other risks.  Hepatitis C blood test.  Hepatitis B blood test.  Sexually transmitted disease (STD) testing.  Diabetes screening. This is done by checking your blood sugar (glucose) after you have not eaten for a while (fasting). You may have this done every 1-3 years.  Abdominal aortic aneurysm (AAA) screening. You may need this if you are a current or former smoker.  Osteoporosis. You may be screened starting at age 73 if you are at high risk. Talk with your health care provider about your test results, treatment options, and if necessary,  the need for more tests. Vaccines  Your health care provider may recommend certain vaccines, such as:  Influenza vaccine. This is recommended every year.  Tetanus, diphtheria, and acellular pertussis (Tdap, Td) vaccine. You may need a Td  booster every 10 years.  Zoster vaccine. You may need this after age 53.  Pneumococcal 13-valent conjugate (PCV13) vaccine. One dose is recommended after age 70.  Pneumococcal polysaccharide (PPSV23) vaccine. One dose is recommended after age 14. Talk to your health care provider about which screenings and vaccines you need and how often you need them. This information is not intended to replace advice given to you by your health care provider. Make sure you discuss any questions you have with your health care provider. Document Released: 11/16/2015 Document Revised: 07/09/2016 Document Reviewed: 08/21/2015 Elsevier Interactive Patient Education  2017 [place] Prevention in the Home Falls can cause injuries. They can happen to people of all ages. There are many things you can do to make your home safe and to help prevent falls. What can I do on the outside of my home?  Regularly fix the edges of walkways and driveways and fix any cracks.  Remove anything that might make you trip as you walk through a door, such as a raised step or threshold.  Trim any bushes or trees on the path to your home.  Use bright outdoor lighting.  Clear any walking paths of anything that might make someone trip, such as rocks or tools.  Regularly check to see if handrails are loose or broken. Make sure that both sides of any steps have handrails.  Any raised decks and porches should have guardrails on the edges.  Have any leaves, snow, or ice cleared regularly.  Use sand or salt on walking paths during winter.  Clean up any spills in your garage right away. This includes oil or grease spills. What can I do in the bathroom?  Use night lights.  Install grab bars by the toilet and in the tub and shower. Do not use towel bars as grab bars.  Use non-skid mats or decals in the tub or shower.  If you need to sit down in the shower, use a plastic, non-slip stool.  Keep the floor dry. Clean up  any water that spills on the floor as soon as it happens.  Remove soap buildup in the tub or shower regularly.  Attach bath mats securely with double-sided non-slip rug tape.  Do not have throw rugs and other things on the floor that can make you trip. What can I do in the bedroom?  Use night lights.  Make sure that you have a light by your bed that is easy to reach.  Do not use any sheets or blankets that are too big for your bed. They should not hang down onto the floor.  Have a firm chair that has side arms. You can use this for support while you get dressed.  Do not have throw rugs and other things on the floor that can make you trip. What can I do in the kitchen?  Clean up any spills right away.  Avoid walking on wet floors.  Keep items that you use a lot in easy-to-reach places.  If you need to reach something above you, use a strong step stool that has a grab bar.  Keep electrical cords out of the way.  Do not use floor polish or wax that makes floors slippery. If you must use  wax, use non-skid floor wax.  Do not have throw rugs and other things on the floor that can make you trip. What can I do with my stairs?  Do not leave any items on the stairs.  Make sure that there are handrails on both sides of the stairs and use them. Fix handrails that are broken or loose. Make sure that handrails are as long as the stairways.  Check any carpeting to make sure that it is firmly attached to the stairs. Fix any carpet that is loose or worn.  Avoid having throw rugs at the top or bottom of the stairs. If you do have throw rugs, attach them to the floor with carpet tape.  Make sure that you have a light switch at the top of the stairs and the bottom of the stairs. If you do not have them, ask someone to add them for you. What else can I do to help prevent falls?  Wear shoes that:  Do not have high heels.  Have rubber bottoms.  Are comfortable and fit you well.  Are  closed at the toe. Do not wear sandals.  If you use a stepladder:  Make sure that it is fully opened. Do not climb a closed stepladder.  Make sure that both sides of the stepladder are locked into place.  Ask someone to hold it for you, if possible.  Clearly mark and make sure that you can see:  Any grab bars or handrails.  First and last steps.  Where the edge of each step is.  Use tools that help you move around (mobility aids) if they are needed. These include:  Canes.  Walkers.  Scooters.  Crutches.  Turn on the lights when you go into a dark area. Replace any light bulbs as soon as they burn out.  Set up your furniture so you have a clear path. Avoid moving your furniture around.  If any of your floors are uneven, fix them.  If there are any pets around you, be aware of where they are.  Review your medicines with your doctor. Some medicines can make you feel dizzy. This can increase your chance of falling. Ask your doctor what other things that you can do to help prevent falls. This information is not intended to replace advice given to you by your health care provider. Make sure you discuss any questions you have with your health care provider. Document Released: 08/16/2009 Document Revised: 03/27/2016 Document Reviewed: 11/24/2014 Elsevier Interactive Patient Education  2017 [employer].

## 2021-05-07 DIAGNOSIS — H25013 Cortical age-related cataract, bilateral: Secondary | ICD-10-CM | POA: Diagnosis not present

## 2021-09-19 ENCOUNTER — Other Ambulatory Visit: Payer: Self-pay | Admitting: Internal Medicine

## 2021-09-19 DIAGNOSIS — E782 Mixed hyperlipidemia: Secondary | ICD-10-CM

## 2021-09-19 NOTE — Telephone Encounter (Signed)
Requested Prescriptions  Pending Prescriptions Disp Refills  . rosuvastatin (CRESTOR) 5 MG tablet [Pharmacy Med Name: ROSUVASTATIN CALCIUM 5 MG TAB] 90 tablet 3    Sig: TAKE 1 TABLET BY MOUTH EVERY DAY     Cardiovascular:  Antilipid - Statins Passed - 09/19/2021  1:48 AM      Passed - Total Cholesterol in normal range and within 360 days    Cholesterol, Total  Date Value Ref Range Status  09/24/2020 167 100 - 199 mg/dL Final         Passed - LDL in normal range and within 360 days    LDL Chol Calc (NIH)  Date Value Ref Range Status  09/24/2020 87 0 - 99 mg/dL Final         Passed - HDL in normal range and within 360 days    HDL  Date Value Ref Range Status  09/24/2020 56 >39 mg/dL Final         Passed - Triglycerides in normal range and within 360 days    Triglycerides  Date Value Ref Range Status  09/24/2020 139 0 - 149 mg/dL Final         Passed - Patient is not pregnant      Passed - Valid encounter within last 12 months    Recent Outpatient Visits          12 months ago Annual physical exam   [facility] Glean Hess, MD   1 year ago Annual physical exam   [facility] Glean Hess, MD   2 years ago Mixed hyperlipidemia   [facility] Glean Hess, MD   2 years ago Annual physical exam   [facility] Glean Hess, MD   3 years ago Partial tear of left rotator cuff   [facility] Adline Potter, MD      Future Appointments            In 1 week Army Melia Jesse Sans, MD [facility], [facility]

## 2021-10-01 ENCOUNTER — Encounter: Payer: Self-pay | Admitting: Internal Medicine

## 2021-10-01 ENCOUNTER — Other Ambulatory Visit: Payer: Self-pay

## 2021-10-01 ENCOUNTER — Ambulatory Visit (INDEPENDENT_AMBULATORY_CARE_PROVIDER_SITE_OTHER): Payer: PPO | Admitting: Internal Medicine

## 2021-10-01 VITALS — BP 130/88 | HR 70 | Temp 97.4°F | Ht 72.0 in | Wt 198.0 lb

## 2021-10-01 DIAGNOSIS — E782 Mixed hyperlipidemia: Secondary | ICD-10-CM | POA: Diagnosis not present

## 2021-10-01 DIAGNOSIS — Z0000 Encounter for general adult medical examination without abnormal findings: Secondary | ICD-10-CM

## 2021-10-01 DIAGNOSIS — R311 Benign essential microscopic hematuria: Secondary | ICD-10-CM

## 2021-10-01 DIAGNOSIS — E559 Vitamin D deficiency, unspecified: Secondary | ICD-10-CM | POA: Diagnosis not present

## 2021-10-01 DIAGNOSIS — Z125 Encounter for screening for malignant neoplasm of prostate: Secondary | ICD-10-CM

## 2021-10-01 NOTE — Progress Notes (Signed)
Date:  10/01/2021   Name:  Anthony Foley   DOB:  1950-12-13   MRN:  [id number]   Chief Complaint: Annual Exam REAL CONA is a 70 y.o. male who presents today for his Complete Annual Exam. He feels well. He reports exercising walking and elliptical. He reports he is sleeping well.   Colonoscopy: 10/2018 repeat 5 yrs  Immunization History  Administered Date(s) Administered   Influenza, High Dose Seasonal PF 10/21/2017, 09/21/2018, 08/26/2019   Influenza,inj,Quad PF,6+ Mos 09/03/2015, 09/04/2016   Influenza-Unspecified 08/14/2020, 08/09/2021   PFIZER(Purple Top)SARS-COV-2 Vaccination 12/13/2019, 01/03/2020, 08/14/2020   Pfizer Covid-19 Vaccine Bivalent Booster 95yrs & up 08/09/2021   Pneumococcal Conjugate-13 10/21/2017   Pneumococcal Polysaccharide-23 11/22/2018   Tdap 10/15/2016   Zoster Recombinat (Shingrix) 08/26/2019, 10/31/2019    Hyperlipidemia This is a chronic problem. The problem is controlled. Pertinent negatives include no chest pain, myalgias or shortness of breath. Current antihyperlipidemic treatment includes statins. The current treatment provides significant improvement of lipids. There are no compliance problems.    Lab Results  Component Value Date   NA 143 09/24/2020   K 4.5 09/24/2020   CO2 24 09/24/2020   GLUCOSE 86 09/24/2020   BUN 17 09/24/2020   CREATININE 1.18 09/24/2020   CALCIUM 9.8 09/24/2020   GFRNONAA 63 09/24/2020   Lab Results  Component Value Date   CHOL 167 09/24/2020   HDL 56 09/24/2020   LDLCALC 87 09/24/2020   TRIG 139 09/24/2020   CHOLHDL 3.0 09/24/2020   No results found for: TSH No results found for: HGBA1C Lab Results  Component Value Date   WBC 5.4 09/23/2019   HGB 14.8 09/23/2019   HCT 44.8 09/23/2019   MCV 92 09/23/2019   PLT 226 09/23/2019   Lab Results  Component Value Date   ALT 15 09/24/2020   AST 18 09/24/2020   ALKPHOS 89 09/24/2020   BILITOT 0.7 09/24/2020   Lab Results  Component Value  Date   PSA1 1.1 09/24/2020   PSA1 0.9 09/23/2019   PSA1 1.3 09/21/2018     Review of Systems  Constitutional:  Negative for appetite change, chills, diaphoresis, fatigue and unexpected weight change.  HENT:  Negative for hearing loss, tinnitus, trouble swallowing and voice change.   Eyes:  Negative for visual disturbance.  Respiratory:  Negative for choking, shortness of breath and wheezing.   Cardiovascular:  Negative for chest pain, palpitations and leg swelling.  Gastrointestinal:  Negative for abdominal pain, blood in stool, constipation and diarrhea.  Genitourinary:  Negative for difficulty urinating, dysuria, frequency and hematuria.  Musculoskeletal:  Positive for arthralgias (right knee). Negative for back pain and myalgias.  Skin:  Negative for color change and rash.  Neurological:  Negative for dizziness, syncope and headaches.  Hematological:  Negative for adenopathy.  Psychiatric/Behavioral:  Negative for dysphoric mood and sleep disturbance. The patient is not nervous/anxious.    Patient Active Problem List   Diagnosis Date Noted   Benign microscopic hematuria 09/24/2020   Benign neoplasm of transverse colon    Pain of left hip joint 09/21/2018   Partial tear of left rotator cuff 04/21/2017   Nontraumatic rupture of long head of biceps tendon 04/21/2017   Osteoarthritis of knee 04/21/2017   Allergic rhinitis 04/21/2017   Vitamin D deficiency, unspecified 06/25/2015   FH: abdominal aortic aneurysm 06/08/2015   Overweight (BMI 25.0-29.9) 06/08/2015   Mixed hyperlipidemia 06/08/2015    No Known Allergies  Past Surgical History:  Procedure Laterality Date  CHOLECYSTECTOMY  1986   COLONOSCOPY  2007   divert- [place] [facility]   COLONOSCOPY WITH PROPOFOL N/A 10/15/2018   Procedure: COLONOSCOPY WITH PROPOFOL;  Surgeon: Lucilla Lame, MD;  Location: [place];  Service: Endoscopy;  Laterality: N/A;   HERNIA REPAIR  2008   right inguinal   KNEE ARTHROSCOPY      POLYPECTOMY  10/15/2018   Procedure: POLYPECTOMY;  Surgeon: Lucilla Lame, MD;  Location: [place];  Service: Endoscopy;;   SHOULDER SURGERY     TONSILLECTOMY      Social History   Tobacco Use   Smoking status: Never   Smokeless tobacco: Never  Vaping Use   Vaping Use: Never used  Substance Use Topics   Alcohol use: Not Currently    Alcohol/week: 0.0 standard drinks    Comment: once monthly- rare   Drug use: No     Medication list has been reviewed and updated.  Current Meds  Medication Sig   cetirizine (ZYRTEC) 10 MG tablet Take 1 tablet (10 mg total) by mouth daily as needed for allergies.   Cholecalciferol (VITAMIN D-3) 1000 units CAPS Take 1 capsule (1,000 Units total) by mouth daily.   ibuprofen (ADVIL,MOTRIN) 200 MG tablet Take by mouth every 6 (six) hours as needed.   rosuvastatin (CRESTOR) 5 MG tablet TAKE 1 TABLET BY MOUTH EVERY DAY    PHQ 2/9 Scores 10/01/2021 12/17/2020 09/24/2020 09/23/2019  PHQ - 2 Score 0 0 0 0  PHQ- 9 Score 0 - 0 -    GAD 7 : Generalized Anxiety Score 10/01/2021 09/24/2020  Nervous, Anxious, on Edge 0 0  Control/stop worrying 0 0  Worry too much - different things 0 0  Trouble relaxing 0 0  Restless 0 0  Easily annoyed or irritable 0 0  Afraid - awful might happen 0 0  Total GAD 7 Score 0 0  Anxiety Difficulty Not difficult at all Not difficult at all    BP Readings from Last 3 Encounters:  10/01/21 130/88  09/24/20 122/78  08/02/20 (!) 164/99    Physical Exam Vitals and nursing note reviewed.  Constitutional:      Appearance: Normal appearance. He is well-developed.  HENT:     Head: Normocephalic.     Right Ear: Tympanic membrane, ear canal and external ear normal.     Left Ear: Tympanic membrane, ear canal and external ear normal.     Nose: Nose normal.  Eyes:     Conjunctiva/sclera: Conjunctivae normal.     Pupils: Pupils are equal, round, and reactive to light.  Neck:     Thyroid: No thyromegaly.      Vascular: No carotid bruit.  Cardiovascular:     Rate and Rhythm: Normal rate and regular rhythm.     Heart sounds: Normal heart sounds.  Pulmonary:     Effort: Pulmonary effort is normal.     Breath sounds: Normal breath sounds. No wheezing.  Chest:  Breasts:    Right: No mass.     Left: No mass.  Abdominal:     General: Bowel sounds are normal.     Palpations: Abdomen is soft.     Tenderness: There is no abdominal tenderness.  Musculoskeletal:        General: Swelling (bony enlargement of right knee) present. Normal range of motion.     Cervical back: Normal range of motion and neck supple.     Right lower leg: No edema.     Left lower leg:  No edema.  Lymphadenopathy:     Cervical: No cervical adenopathy.  Skin:    General: Skin is warm and dry.     Capillary Refill: Capillary refill takes less than 2 seconds.  Neurological:     General: No focal deficit present.     Mental Status: He is alert and oriented to person, place, and time.     Deep Tendon Reflexes: Reflexes are normal and symmetric.  Psychiatric:        Attention and Perception: Attention normal.        Mood and Affect: Mood normal.        Thought Content: Thought content normal.    Wt Readings from Last 3 Encounters:  10/01/21 198 lb (89.8 kg)  09/24/20 194 lb (88 kg)  08/02/20 187 lb (84.8 kg)    BP 130/88   Pulse 70   Temp (!) 97.4 F (36.3 C) (Oral)   Ht 6' (1.829 m)   Wt 198 lb (89.8 kg)   SpO2 99%   BMI 26.85 kg/m   Assessment and Plan: 1. Annual physical exam Normal exam Continue healthy diet and regular exercise Up to date on screenings and immunizations. - CBC with Differential/Platelet - Hemoglobin A1c  2. Prostate cancer screening DRE deferred - PSA  3. Mixed hyperlipidemia Tolerating statin medication without side effects at this time Continue same therapy without change at this time. - Comprehensive metabolic panel - Lipid panel  4. Vitamin D deficiency,  unspecified Supplemented daily; check levels and advise - VITAMIN D 25 Hydroxy (Vit-D Deficiency, Fractures)  5. Benign microscopic hematuria Worked up by Urology Plan for repeat testing next year   Partially dictated using Editor, commissioning. Any errors are unintentional.  Halina Maidens, MD [facility]  10/01/2021

## 2021-10-02 LAB — PSA: Prostate Specific Ag, Serum: 1.2 ng/mL (ref 0.0–4.0)

## 2021-10-02 LAB — CBC WITH DIFFERENTIAL/PLATELET
Basophils Absolute: 0 10*3/uL (ref 0.0–0.2)
Basos: 1 %
EOS (ABSOLUTE): 0.1 10*3/uL (ref 0.0–0.4)
Eos: 2 %
Hematocrit: 44.9 % (ref 37.5–51.0)
Hemoglobin: 15.1 g/dL (ref 13.0–17.7)
Immature Grans (Abs): 0 10*3/uL (ref 0.0–0.1)
Immature Granulocytes: 0 %
Lymphocytes Absolute: 1.8 10*3/uL (ref 0.7–3.1)
Lymphs: 32 %
MCH: 30 pg (ref 26.6–33.0)
MCHC: 33.6 g/dL (ref 31.5–35.7)
MCV: 89 fL (ref 79–97)
Monocytes Absolute: 0.4 10*3/uL (ref 0.1–0.9)
Monocytes: 8 %
Neutrophils Absolute: 3.3 10*3/uL (ref 1.4–7.0)
Neutrophils: 57 %
Platelets: 234 10*3/uL (ref 150–450)
RBC: 5.03 x10E6/uL (ref 4.14–5.80)
RDW: 13.8 % (ref 11.6–15.4)
WBC: 5.7 10*3/uL (ref 3.4–10.8)

## 2021-10-02 LAB — COMPREHENSIVE METABOLIC PANEL
ALT: 23 IU/L (ref 0–44)
AST: 21 IU/L (ref 0–40)
Albumin/Globulin Ratio: 1.8 (ref 1.2–2.2)
Albumin: 4.6 g/dL (ref 3.8–4.8)
Alkaline Phosphatase: 108 IU/L (ref 44–121)
BUN/Creatinine Ratio: 20 (ref 10–24)
BUN: 22 mg/dL (ref 8–27)
Bilirubin Total: 0.7 mg/dL (ref 0.0–1.2)
CO2: 26 mmol/L (ref 20–29)
Calcium: 9.5 mg/dL (ref 8.6–10.2)
Chloride: 102 mmol/L (ref 96–106)
Creatinine, Ser: 1.08 mg/dL (ref 0.76–1.27)
Globulin, Total: 2.5 g/dL (ref 1.5–4.5)
Glucose: 86 mg/dL (ref 70–99)
Potassium: 4.1 mmol/L (ref 3.5–5.2)
Sodium: 142 mmol/L (ref 134–144)
Total Protein: 7.1 g/dL (ref 6.0–8.5)
eGFR: 74 mL/min/{1.73_m2} (ref >59)

## 2021-10-02 LAB — HEMOGLOBIN A1C
Est. average glucose Bld gHb Est-mCnc: 108 mg/dL
Hgb A1c MFr Bld: 5.4 % (ref 4.8–5.6)

## 2021-10-02 LAB — LIPID PANEL
Chol/HDL Ratio: 3.2 ratio (ref 0.0–5.0)
Cholesterol, Total: 168 mg/dL (ref 100–199)
HDL: 52 mg/dL (ref >39)
LDL Chol Calc (NIH): 90 mg/dL (ref 0–99)
Triglycerides: 147 mg/dL (ref 0–149)
VLDL Cholesterol Cal: 26 mg/dL (ref 5–40)

## 2021-10-02 LAB — VITAMIN D 25 HYDROXY (VIT D DEFICIENCY, FRACTURES): Vit D, 25-Hydroxy: 39.3 ng/mL (ref 30.0–100.0)

## 2021-10-14 DIAGNOSIS — M545 Low back pain, unspecified: Secondary | ICD-10-CM | POA: Diagnosis not present

## 2021-10-24 IMAGING — US US SCROTUM W/ DOPPLER COMPLETE
1 series · 14 of 25 positions shown · non-contrast
Comparison: None.

CLINICAL DATA: Right scrotal and groin pain. History of right
inguinal hernia repair 8888.

EXAM:
SCROTAL ULTRASOUND
DOPPLER ULTRASOUND OF THE TESTICLES
TECHNIQUE: Complete ultrasound examination of the testicles, epididymis, and
other scrotal structures was performed. Color and spectral Doppler
ultrasound were also utilized to evaluate blood flow to the
testicles.

[Series 1: us scrotum w/doppler · 14 of 58 slices shown]
[im 1/58]
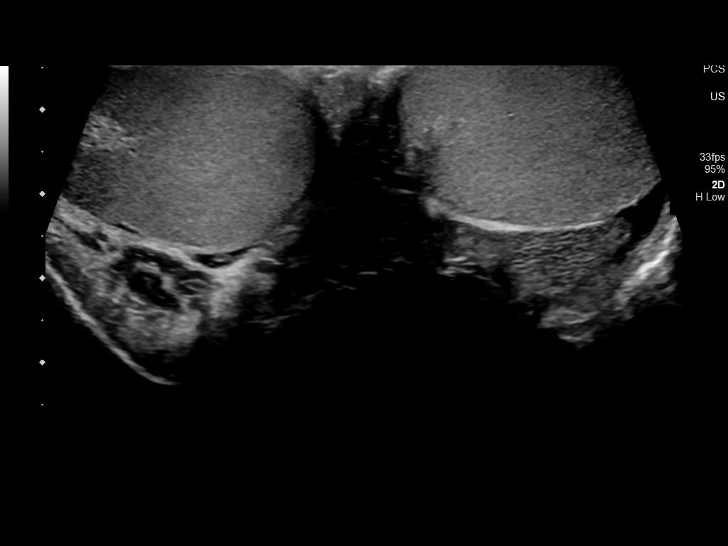
[im 5/58]
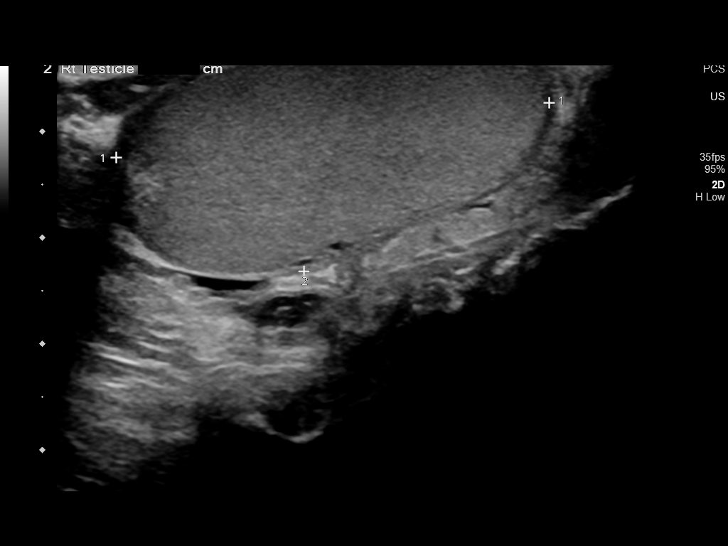
[im 10/58]
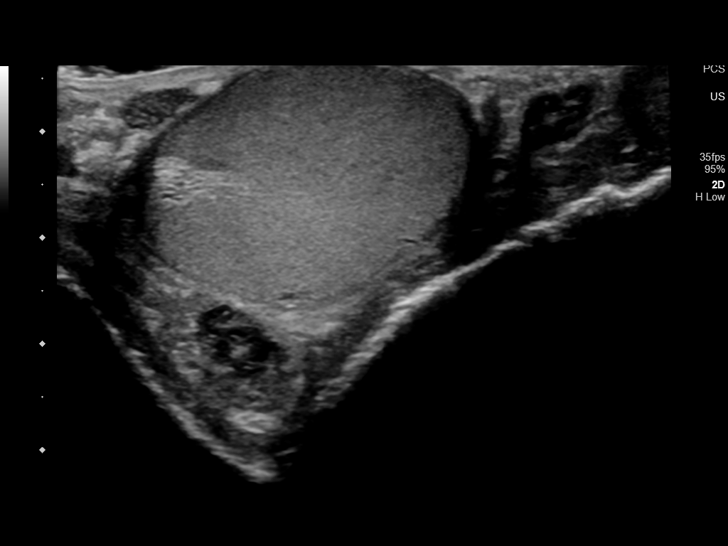
[im 15/58]
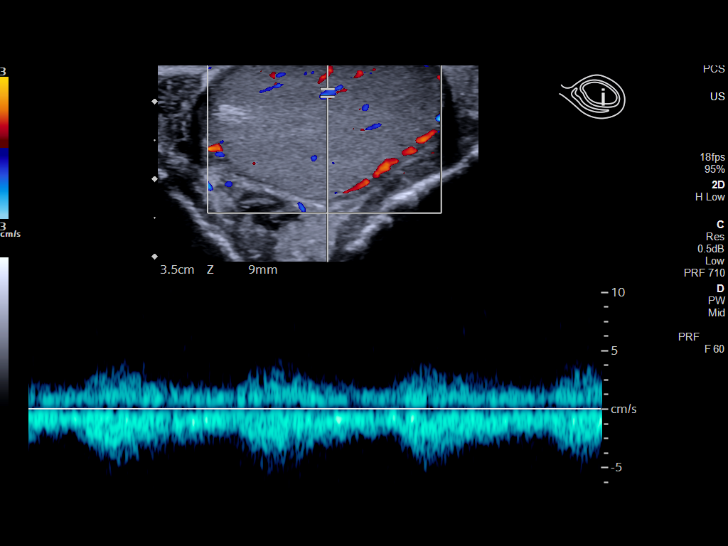
[im 20/58]
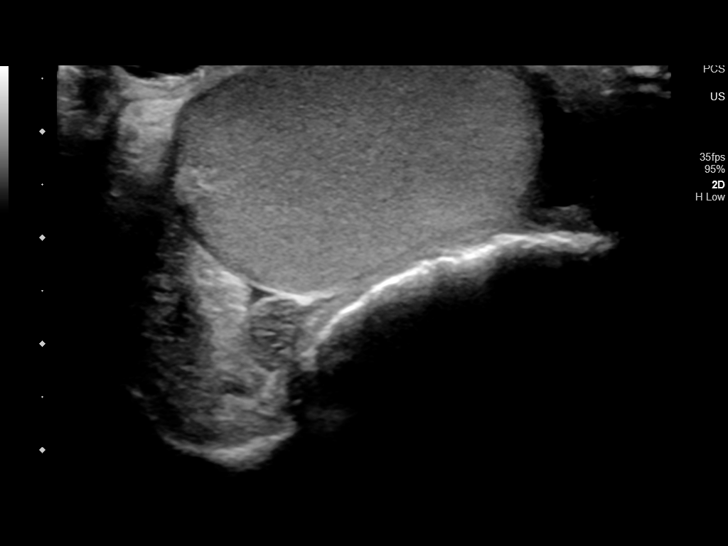
[im 22/58]
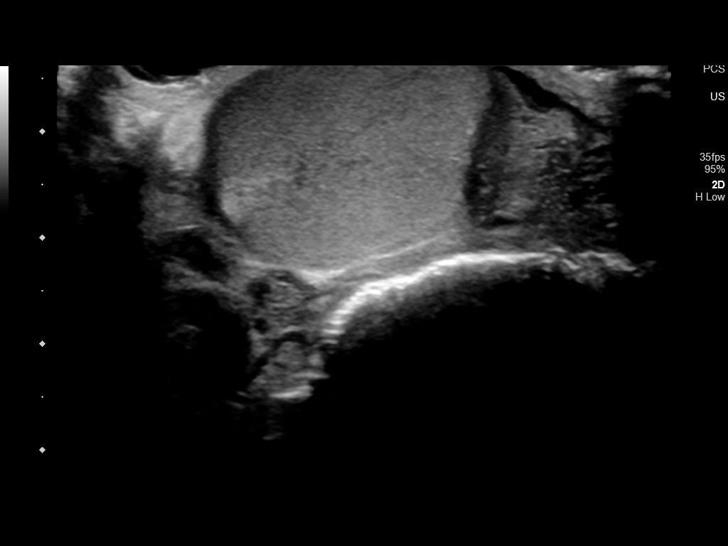
[im 27/58]
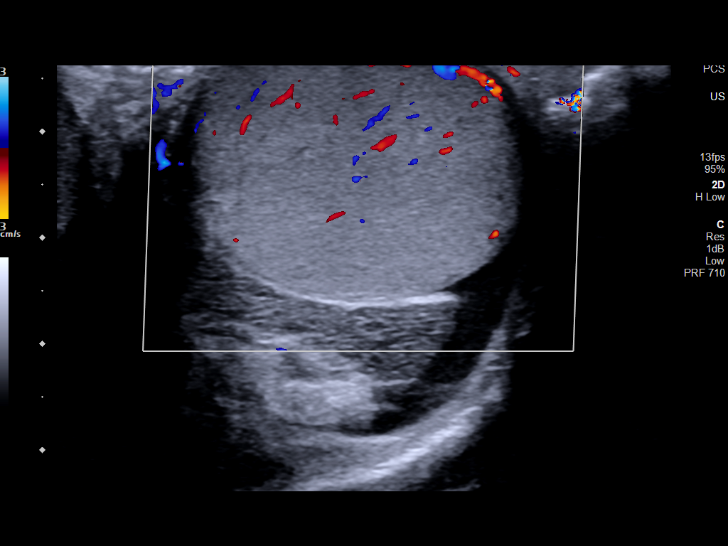
[im 31/58]
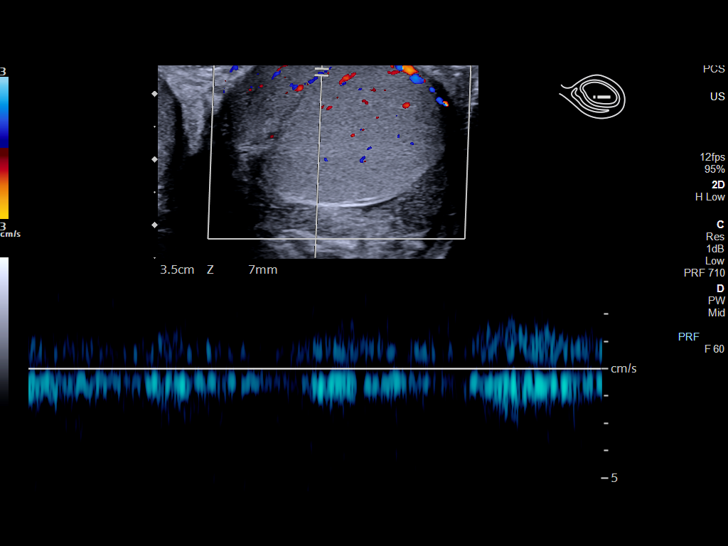
[im 36/58]
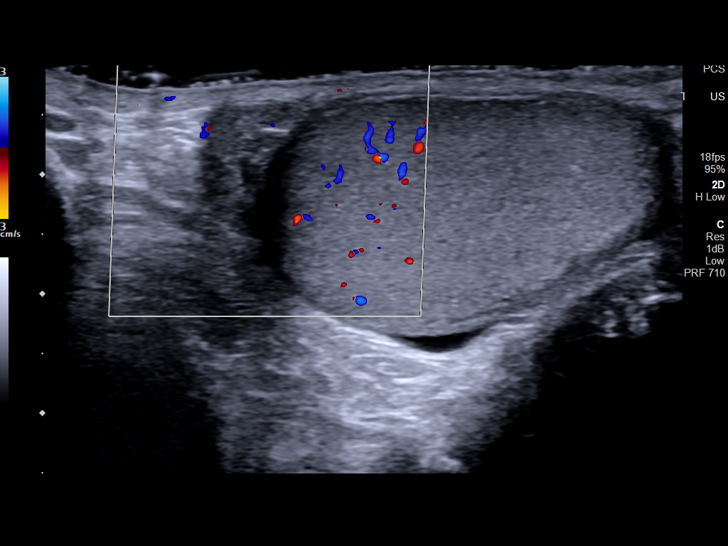
[im 39/58]
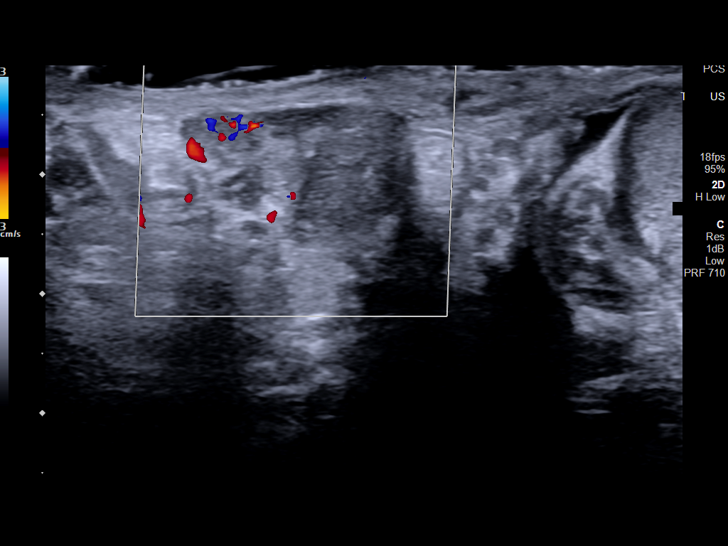
[im 43/58]
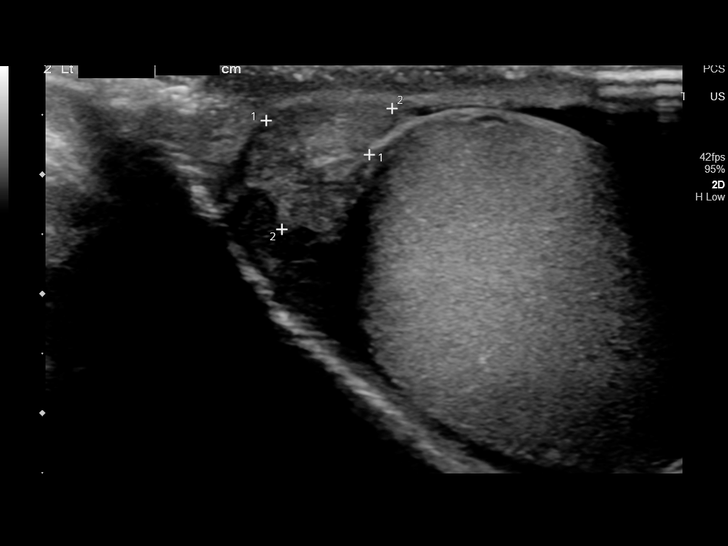
[im 48/58]
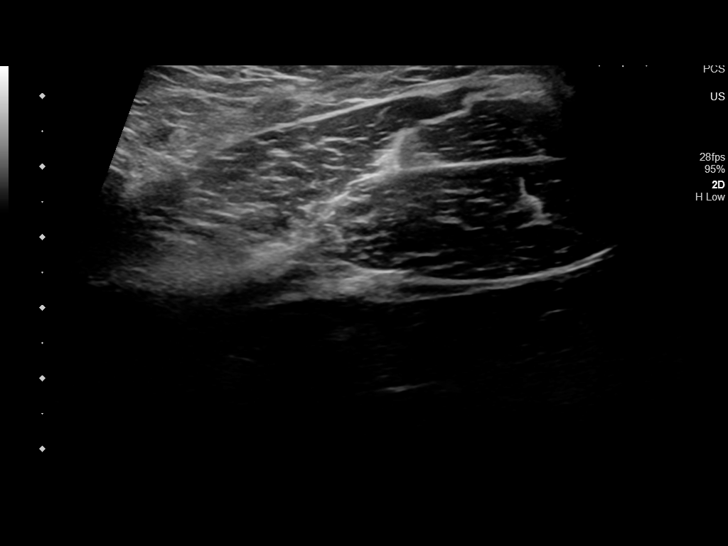
[im 53/58]
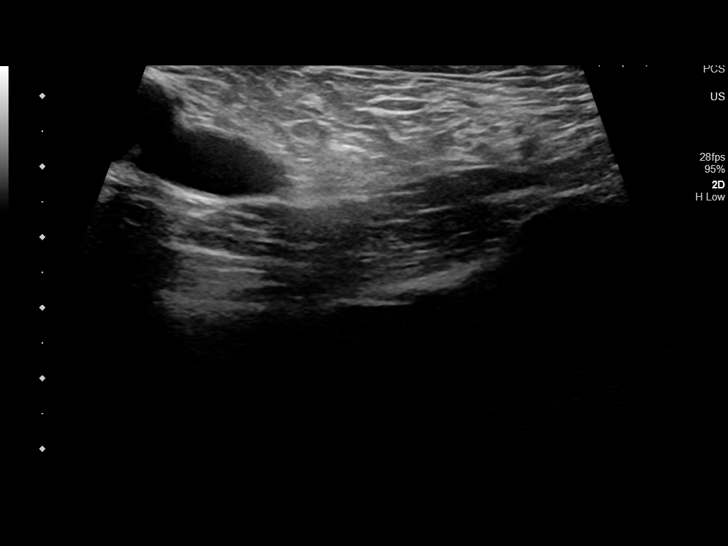
[im 58/58]
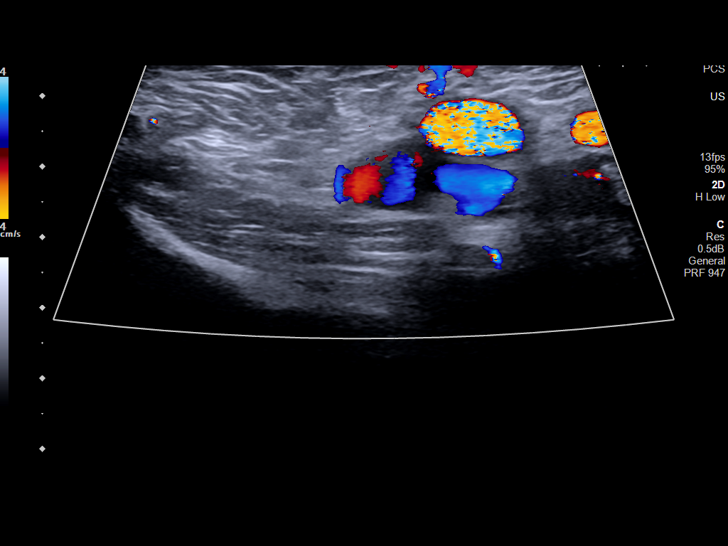

[14 of 25 positions shown; findings below may reference images not displayed]

FINDINGS: Right testicle

Measurements: 4.1 x 2.0 x 3.1 cm. Symmetric and homogeneous
echotexture without focal lesion. Patent arterial and venous blood
flow.

Left testicle

Measurements: 3.5 x 2.1 x 3.2 cm. Symmetric and homogeneous
echotexture without focal lesion. Patent arterial and venous blood
flow.

Right epididymis:  Normal in size and appearance.

Left epididymis:  Normal in size and appearance.

Hydrocele:  None visualized.

Varicocele:  None visualized.

Pulsed Doppler interrogation of both testes demonstrates normal low
resistance arterial and venous waveforms bilaterally.
IMPRESSION: 1. Normal sonographic appearance of both testicles. Patent blood
flow.
2. No hydrocele or obvious scrotal hernia.

## 2021-10-24 IMAGING — US US RENAL
1 series · 14 of 25 positions shown · non-contrast
Comparison: Retroperitoneal ultrasound June 19, 2015.

CLINICAL DATA: Right flank region pain

EXAM:
RENAL / URINARY TRACT ULTRASOUND COMPLETE

[Series 1: us renal · 14 of 43 slices shown]
[im 1/43]
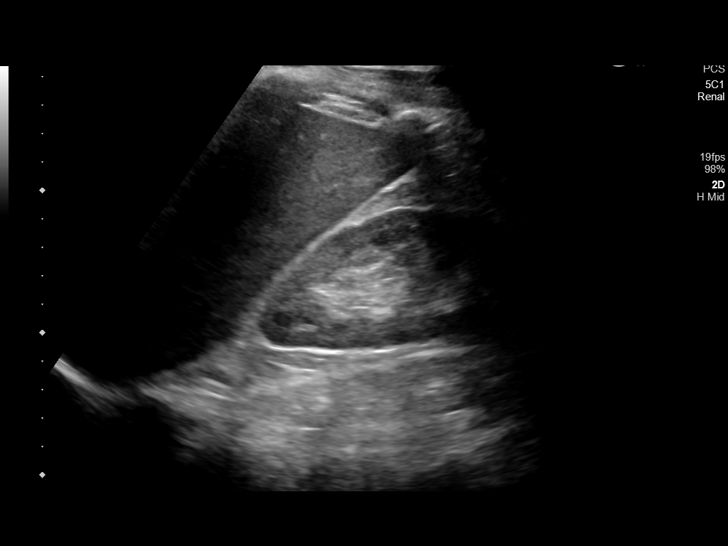
[im 4/43]
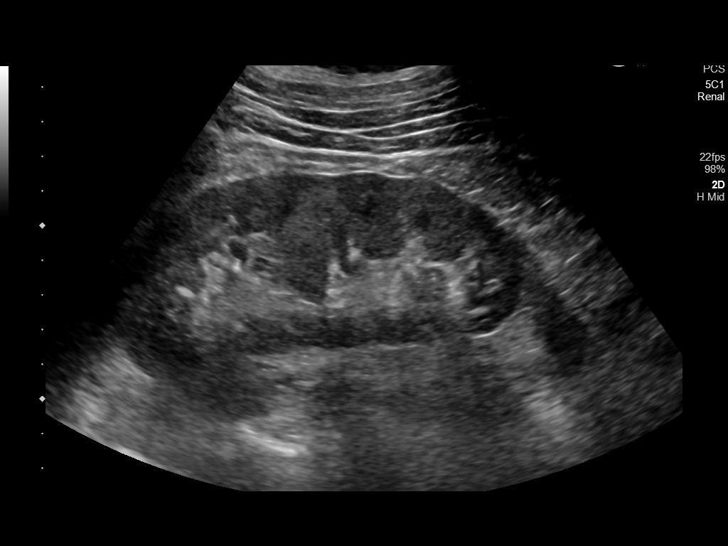
[im 8/43]
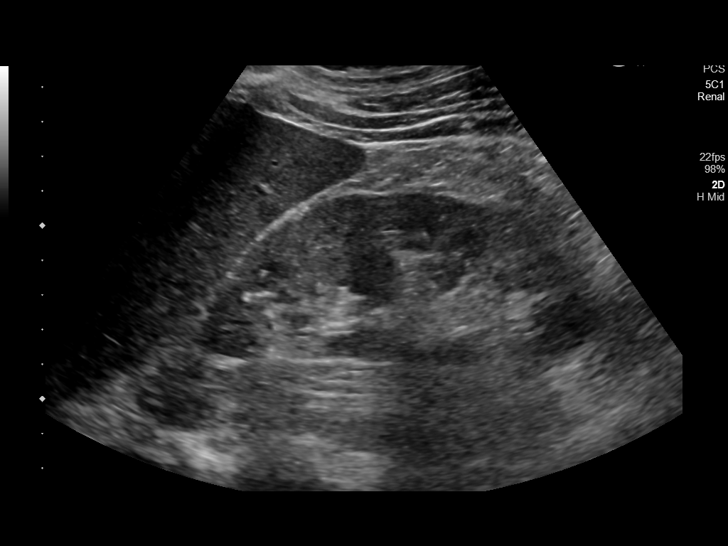
[im 11/43]
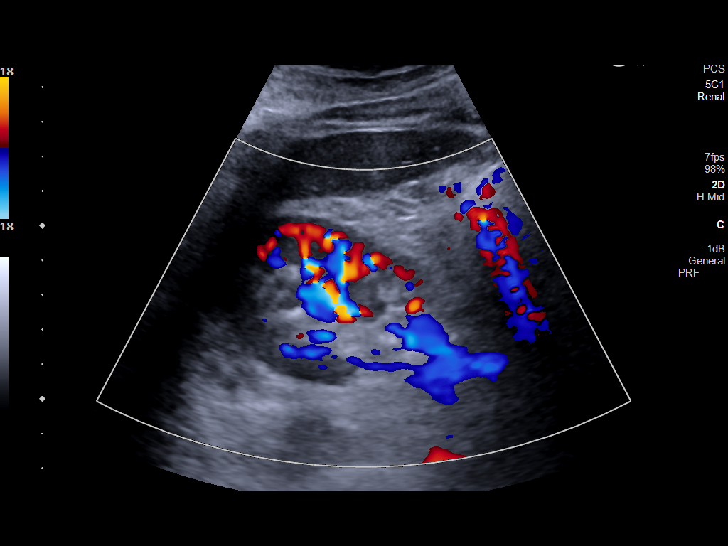
[im 15/43]
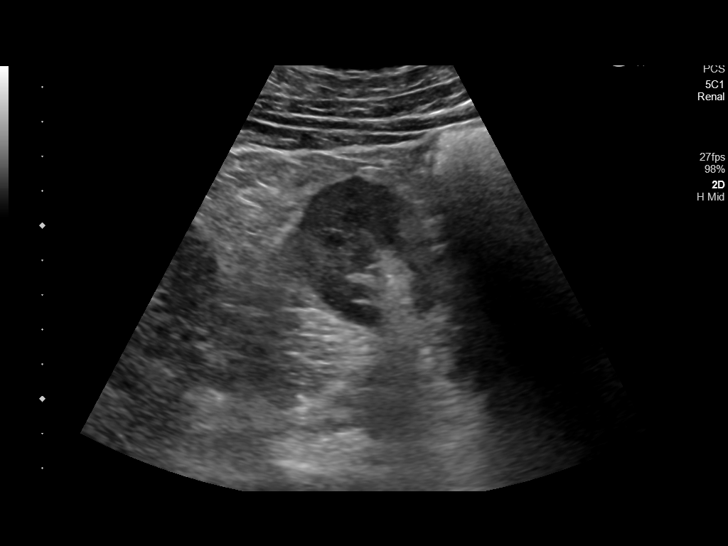
[im 16/43]
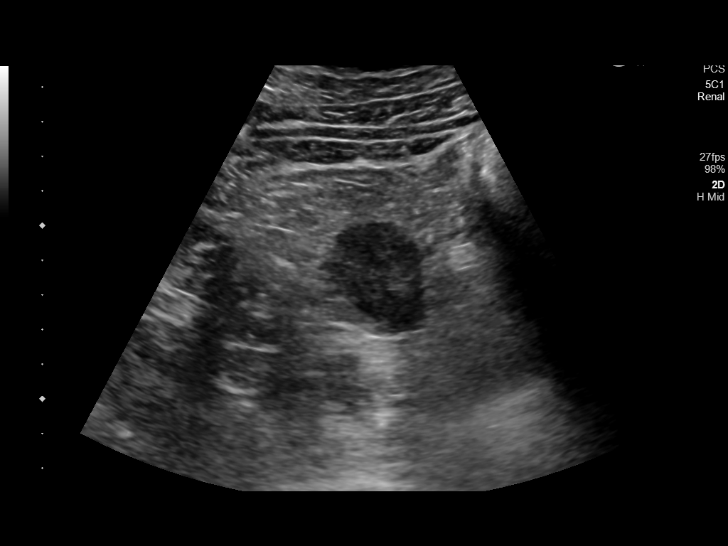
[im 20/43]
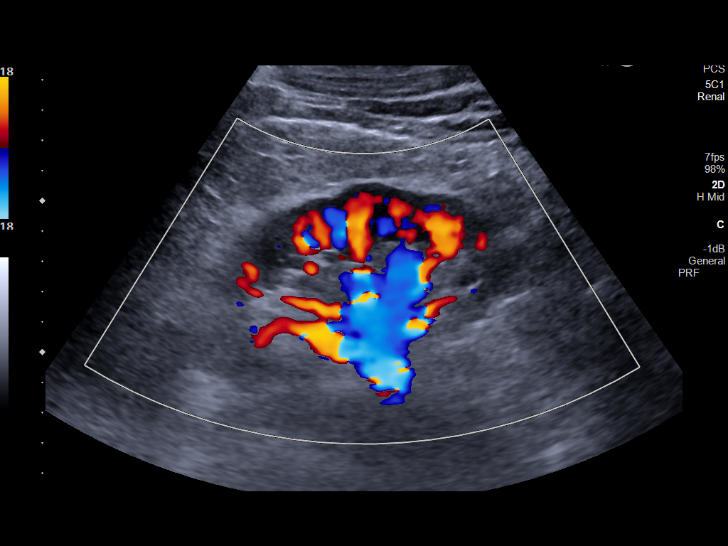
[im 23/43]
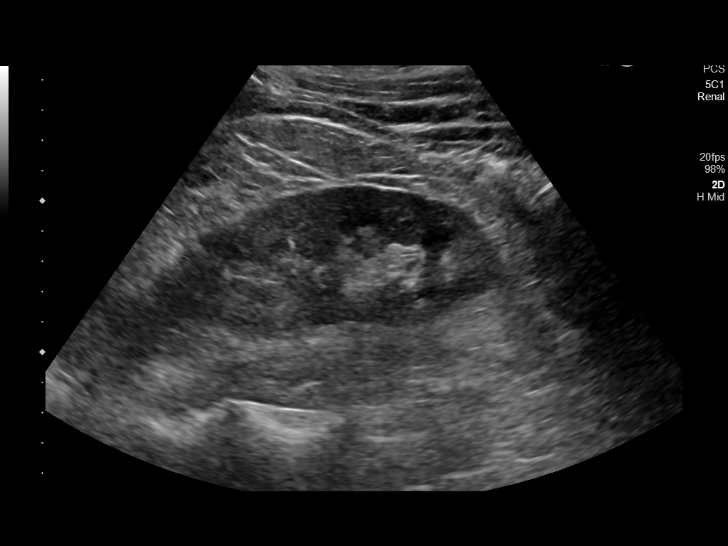
[im 27/43]
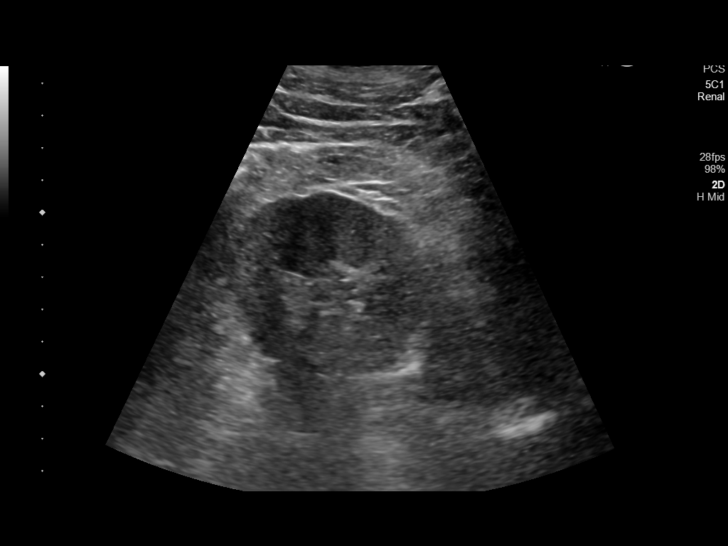
[im 29/43]
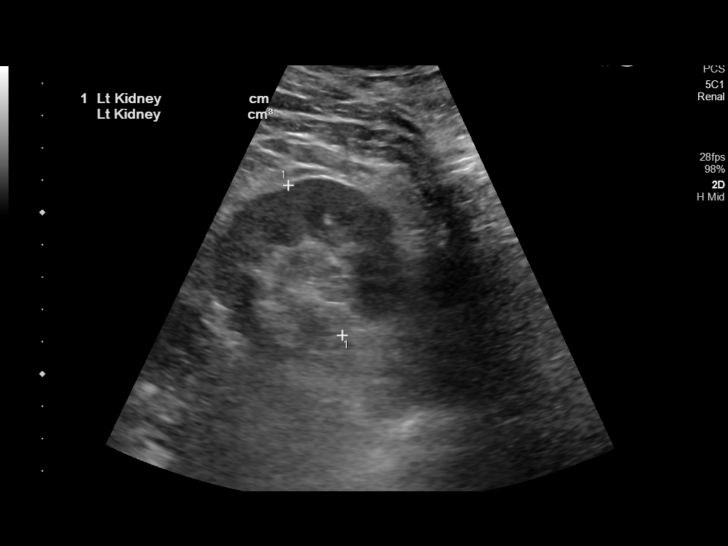
[im 32/43]
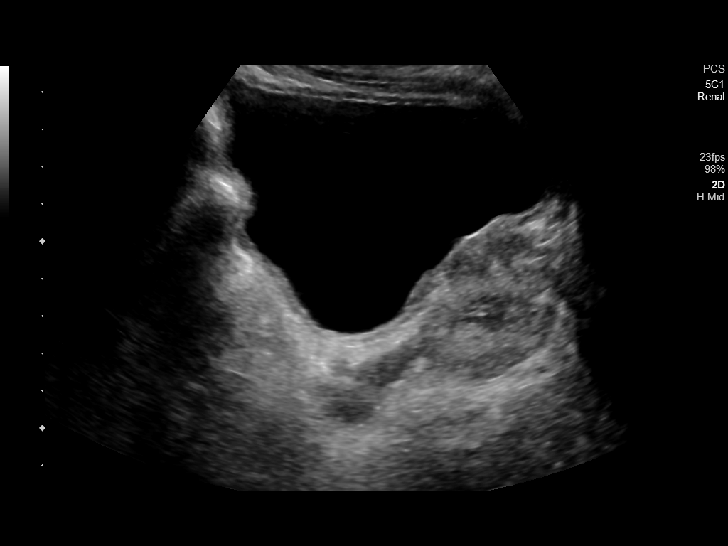
[im 36/43]
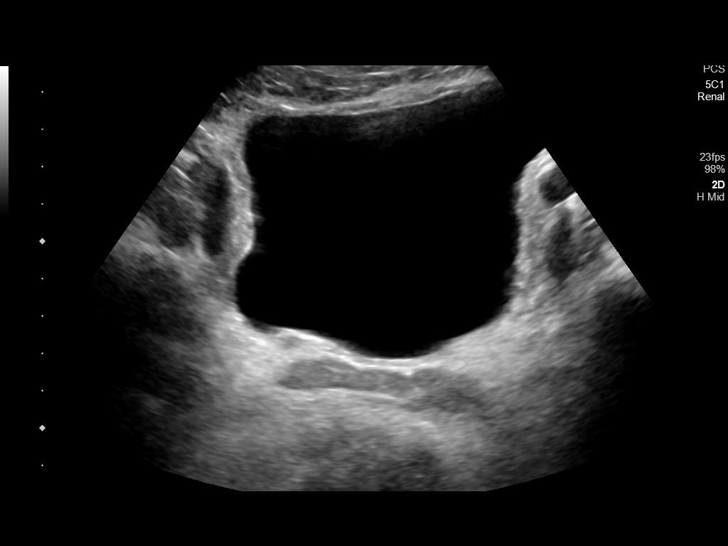
[im 39/43]
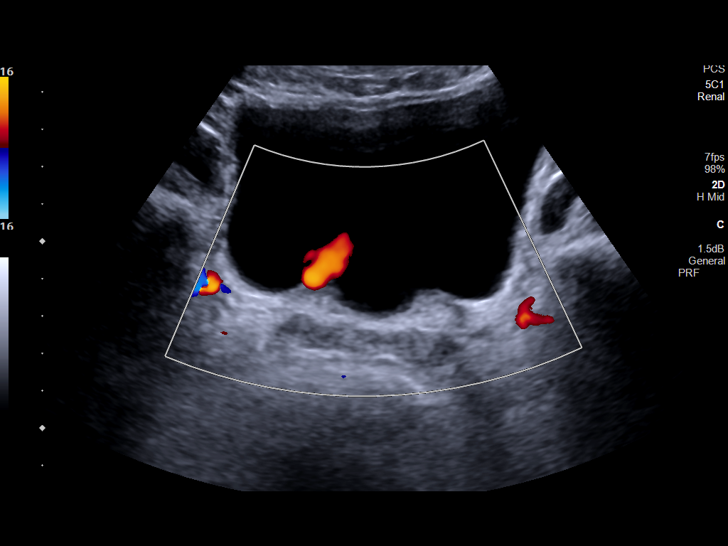
[im 43/43]
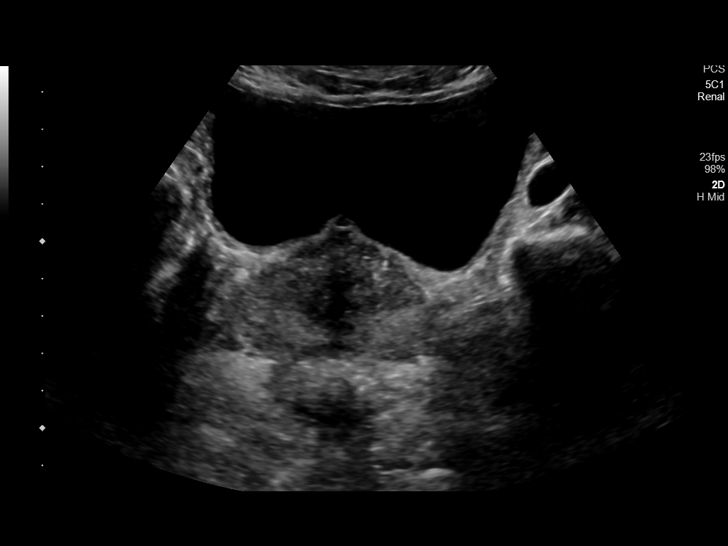

[14 of 25 positions shown; findings below may reference images not displayed]

FINDINGS: Right Kidney:

Renal measurements: 12.0 x 5.2 x 5.5 cm = volume: 175.9 mL .
Echogenicity and renal cortical thickness are within normal limits.
No mass, perinephric fluid, or hydronephrosis visualized. No
sonographically demonstrable calculus or ureterectasis.

Left Kidney:

Renal measurements: 11.8 x 5.8 x 4.9 cm = volume: 174.6 mL.
Echogenicity and renal cortical thickness are within normal limits.
No mass, perinephric fluid, or hydronephrosis visualized. No
sonographically demonstrable calculus or ureterectasis.

Bladder:

Appears normal for degree of bladder distention.

Other:

Prostate measures 4.4 x 3.6 x 6.7 cm. There are calculi within the
prostate.
IMPRESSION: Prostate prominent, containing several calculi.

Study otherwise unremarkable.

## 2021-10-30 DIAGNOSIS — M1711 Unilateral primary osteoarthritis, right knee: Secondary | ICD-10-CM | POA: Diagnosis not present

## 2021-12-18 ENCOUNTER — Ambulatory Visit (INDEPENDENT_AMBULATORY_CARE_PROVIDER_SITE_OTHER): Payer: PPO

## 2021-12-18 ENCOUNTER — Other Ambulatory Visit: Payer: Self-pay | Admitting: Internal Medicine

## 2021-12-18 DIAGNOSIS — Z0000 Encounter for general adult medical examination without abnormal findings: Secondary | ICD-10-CM | POA: Diagnosis not present

## 2021-12-18 DIAGNOSIS — E782 Mixed hyperlipidemia: Secondary | ICD-10-CM

## 2021-12-18 NOTE — Progress Notes (Signed)
Subjective:   Anthony Foley is a 71 y.o. male who presents for Medicare Annual/Subsequent preventive examination.  Virtual Visit via Telephone Note  I connected with  Anthony Foley on 12/18/21 at  8:00 AM EST by telephone and verified that I am speaking with the correct person using two identifiers.  Location: Patient: home Provider: [facility] Persons participating in the virtual visit: Anthony Foley   I discussed the limitations, risks, security and privacy concerns of performing an evaluation and management service by telephone and the availability of in person appointments. The patient expressed understanding and agreed to proceed.  Interactive audio and video telecommunications were attempted between this nurse and patient, however failed, due to patient having technical difficulties OR patient did not have access to video capability.  We continued and completed visit with audio only.  Some vital signs may be absent or patient reported.   Anthony Marker, LPN   Review of Systems     Cardiac Risk Factors include: advanced age (>30men, >82 women);dyslipidemia;male gender     Objective:    There were no vitals filed for this visit. There is no height or weight on file to calculate BMI.  Advanced Directives 12/18/2021 12/17/2020 05/08/2020 09/14/2019 10/15/2018 04/17/2017 10/15/2016  Does Patient Have a Medical Advance Directive? No No No No No No No  Would patient like information on creating a medical advance directive? No - Patient declined No - Patient declined No - Patient declined Yes (MAU/Ambulatory/Procedural Areas - Information given) No - Patient declined No - Patient declined -    Current Medications (verified) Outpatient Encounter Medications as of 12/18/2021  Medication Sig   cetirizine (ZYRTEC) 10 MG tablet Take 1 tablet (10 mg total) by mouth daily as needed for allergies.   Cholecalciferol (VITAMIN D-3) 1000 units CAPS Take 1 capsule (1,000 Units  total) by mouth daily.   ibuprofen (ADVIL,MOTRIN) 200 MG tablet Take by mouth every 6 (six) hours as needed.   rosuvastatin (CRESTOR) 5 MG tablet TAKE 1 TABLET BY MOUTH EVERY DAY   No facility-administered encounter medications on file as of 12/18/2021.    Allergies (verified) Patient has no known allergies.   History: Past Medical History:  Diagnosis Date   Hyperlipidemia    Shoulder injury    Past Surgical History:  Procedure Laterality Date   CHOLECYSTECTOMY  1986   COLONOSCOPY  2007   divert- [place] [facility]   COLONOSCOPY WITH PROPOFOL N/A 10/15/2018   Procedure: COLONOSCOPY WITH PROPOFOL;  Surgeon: Lucilla Lame, MD;  Location: [place];  Service: Endoscopy;  Laterality: N/A;   HERNIA REPAIR  2008   right inguinal   KNEE ARTHROSCOPY     POLYPECTOMY  10/15/2018   Procedure: POLYPECTOMY;  Surgeon: Lucilla Lame, MD;  Location: [place];  Service: Endoscopy;;   SHOULDER SURGERY     TONSILLECTOMY     Family History  Problem Relation Age of Onset   Yves Dill Parkinson White syndrome Mother    Heart disease Father 25       lived to 22   Uterine cancer Sister    Birth defects Paternal Grandfather    COPD Maternal Aunt    Social History   Socioeconomic History   Marital status: Married    Spouse name: Not on file   Number of children: Not on file   Years of education: Not on file   Highest education level: Not on file  Occupational History   Not on file  Tobacco Use  Smoking status: Never   Smokeless tobacco: Never  Vaping Use   Vaping Use: Never used  Substance and Sexual Activity   Alcohol use: Not Currently    Alcohol/week: 0.0 standard drinks    Comment: once monthly- rare   Drug use: No   Sexual activity: Yes  Other Topics Concern   Not on file  Social History Narrative   Not on file   Social Determinants of Health   Financial Resource Strain: Low Risk    Difficulty of Paying Living Expenses: Not hard at all  Food Insecurity: No Food  Insecurity   Worried About Charity fundraiser in the Last Year: Never true   [place] in the Last Year: Never true  Transportation Needs: No Transportation Needs   Lack of Transportation (Medical): No   Lack of Transportation (Non-Medical): No  Physical Activity: Sufficiently Active   Days of Exercise per Week: 5 days   Minutes of Exercise per Session: 30 min  Stress: No Stress Concern Present   Feeling of Stress : Not at all  Social Connections: Moderately Integrated   Frequency of Communication with Friends and Family: More than three times a week   Frequency of Social Gatherings with Friends and Family: Three times a week   Attends Religious Services: More than 4 times per year   Active Member of Clubs or Organizations: No   Attends Archivist Meetings: Never   Marital Status: Married    Tobacco Counseling Counseling given: Not Answered   Clinical Intake:  Pre-visit preparation completed: Yes  Pain : No/denies pain     Nutritional Risks: None Diabetes: No  How often do you need to have someone help you when you read instructions, pamphlets, or other written materials from your doctor or pharmacy?: 1 - Never    Interpreter Needed?: No  Information entered by :: Anthony Marker LPN   Activities of Daily Living In your present state of health, do you have any difficulty performing the following activities: 12/18/2021 10/01/2021  Hearing? N N  Vision? N N  Difficulty concentrating or making decisions? N N  Walking or climbing stairs? N N  Dressing or bathing? N N  Doing errands, shopping? N N  Preparing Food and eating ? N -  Using the Toilet? N -  In the past six months, have you accidently leaked urine? N -  Do you have problems with loss of bowel control? N -  Managing your Medications? N -  Managing your Finances? N -  Housekeeping or managing your Housekeeping? N -  Some recent data might be hidden    Patient Care Team: Glean Hess, MD as PCP - General (Internal Medicine) Ree Edman, MD (Dermatology)  Indicate any recent Medical Services you may have received from other than [facility] providers in the past year (date may be approximate).     Assessment:   This is a routine wellness examination for Anthony Foley.  Hearing/Vision screen Hearing Screening - Comments:: Pt denies hearing difficulty  Vision Screening - Comments:: Annual vision screenings previously done by Dr. Wyatt Portela  Dietary issues and exercise activities discussed: Current Exercise Habits: Home exercise routine, Type of exercise: Other - see comments (elliptical), Time (Minutes): 30, Frequency (Times/Week): 5, Weekly Exercise (Minutes/Week): 150, Intensity: Mild, Exercise limited by: None identified   Goals Addressed   None    Depression Screen [facility] 2/9 Scores 12/18/2021 10/01/2021 12/17/2020 09/24/2020 09/23/2019 09/14/2019 03/22/2019  PHQ - 2 Score 0  0 0 0 0 0 0  PHQ- 9 Score - 0 - 0 - - -    Fall Risk Fall Risk  12/18/2021 10/01/2021 12/17/2020 09/24/2020 09/23/2019  Falls in the past year? 0 0 0 0 0  Number falls in past yr: 0 0 0 0 0  Injury with Fall? 0 0 0 0 0  Risk for fall due to : No Fall Risks No Fall Risks No Fall Risks - -  Follow up Falls prevention discussed Falls evaluation completed Falls prevention discussed Falls evaluation completed -    FALL RISK PREVENTION PERTAINING TO THE HOME:  Any stairs in or around the home? Yes  If so, are there any without handrails? No  Home free of loose throw rugs in walkways, pet beds, electrical cords, etc? Yes  Adequate lighting in your home to reduce risk of falls? Yes   ASSISTIVE DEVICES UTILIZED TO PREVENT FALLS:  Life alert? No  Use of a cane, walker or w/c? No  Grab bars in the bathroom? No  Shower chair or bench in shower? Yes  Elevated toilet seat or a handicapped toilet? Yes   TIMED UP AND GO:  Was the test performed? No . Telephonic visit.   Cognitive Function: Normal  cognitive status assessed by direct observation by this Nurse Health Advisor. No abnormalities found.       6CIT Screen 09/14/2019  What Year? 0 points  What month? 0 points  What time? 0 points  Count back from 20 0 points  Months in reverse 0 points  Repeat phrase 0 points  Total Score 0    Immunizations Immunization History  Administered Date(s) Administered   Influenza, High Dose Seasonal PF 10/21/2017, 09/21/2018, 08/26/2019   Influenza,inj,Quad PF,6+ Mos 09/03/2015, 09/04/2016   Influenza-Unspecified 08/14/2020, 08/09/2021   PFIZER(Purple Top)SARS-COV-2 Vaccination 12/13/2019, 01/03/2020, 08/14/2020   Pfizer Covid-19 Vaccine Bivalent Booster 3yrs & up 08/09/2021   Pneumococcal Conjugate-13 10/21/2017   Pneumococcal Polysaccharide-23 11/22/2018   Tdap 10/15/2016   Zoster Recombinat (Shingrix) 08/26/2019, 10/31/2019    TDAP status: Up to date  Flu Vaccine status: Up to date  Pneumococcal vaccine status: Up to date  Covid-19 vaccine status: Completed vaccines  Qualifies for Shingles Vaccine? Yes   Zostavax completed No   Shingrix Completed?: Yes  Screening Tests Health Maintenance  Topic Date Due   COLONOSCOPY (Pts 45-58yrs Insurance coverage will need to be confirmed)  10/16/2023   TETANUS/TDAP  10/15/2026   Pneumonia Vaccine 25+ Years old  Completed   INFLUENZA VACCINE  Completed   COVID-19 Vaccine  Completed   Hepatitis C Screening  Completed   Zoster Vaccines- Shingrix  Completed   HPV VACCINES  Aged Out    Health Maintenance  There are no preventive care reminders to display for this patient.  Colorectal cancer screening: Type of screening: Colonoscopy. Completed 10/15/18. Repeat every 5 years  Lung Cancer Screening: (Low Dose CT Chest recommended if Age 58-80 years, 30 pack-year currently smoking OR have quit w/in 15years.) does not qualify.   Additional Screening:  Hepatitis C Screening: does qualify; Completed 10/21/17  Vision Screening:  Recommended annual ophthalmology exams for early detection of glaucoma and other disorders of the eye. Is the patient up to date with their annual eye exam?  Yes  Who is the provider or what is the name of the office in which the patient attends annual eye exams? Dr. Wyatt Portela  Dental Screening: Recommended annual dental exams for proper oral hygiene  Community Resource Referral /  Chronic Care Management: CRR required this visit?  No   CCM required this visit?  No      Plan:     I have personally reviewed and noted the following in the patients chart:   Medical and social history Use of alcohol, tobacco or illicit drugs  Current medications and supplements including opioid prescriptions. Patient is not currently taking opioid prescriptions. Functional ability and status Nutritional status Physical activity Advanced directives List of other physicians Hospitalizations, surgeries, and ER visits in previous 12 months Vitals Screenings to include cognitive, depression, and falls Referrals and appointments  In addition, I have reviewed and discussed with patient certain preventive protocols, quality metrics, and best practice recommendations. A written personalized care plan for preventive services as well as general preventive health recommendations were provided to patient.   Due to this being a telephonic visit, the after visit summary with patients personalized plan was offered to patient via my-chart.   Anthony Marker, LPN   7/41/[id number]   Nurse Notes: none

## 2021-12-18 NOTE — Telephone Encounter (Signed)
Requested Prescriptions  Pending Prescriptions Disp Refills   rosuvastatin (CRESTOR) 5 MG tablet [Pharmacy Med Name: ROSUVASTATIN CALCIUM 5 MG TAB] 90 tablet 1    Sig: TAKE 1 TABLET BY MOUTH EVERY DAY     Cardiovascular:  Antilipid - Statins 2 Failed - 12/18/2021  2:57 AM      Failed - Lipid Panel in normal range within the last 12 months    Cholesterol, Total  Date Value Ref Range Status  10/01/2021 168 100 - 199 mg/dL Final   LDL Chol Calc (NIH)  Date Value Ref Range Status  10/01/2021 90 0 - 99 mg/dL Final   HDL  Date Value Ref Range Status  10/01/2021 52 >39 mg/dL Final   Triglycerides  Date Value Ref Range Status  10/01/2021 147 0 - 149 mg/dL Final         Passed - Cr in normal range and within 360 days    Creatinine, Ser  Date Value Ref Range Status  10/01/2021 1.08 0.76 - 1.27 mg/dL Final         Passed - Patient is not pregnant      Passed - Valid encounter within last 12 months    Recent Outpatient Visits          2 months ago Annual physical exam   [facility] Glean Hess, MD   1 year ago Annual physical exam   [facility] Glean Hess, MD   2 years ago Annual physical exam   [facility] Glean Hess, MD   2 years ago Mixed hyperlipidemia   [facility] Glean Hess, MD   3 years ago Annual physical exam   [facility] Glean Hess, MD      Future Appointments            In 9 months Army Melia Jesse Sans, MD [facility], [facility]

## 2021-12-18 NOTE — Patient Instructions (Signed)
Anthony Foley , Thank you for taking time to come for your Medicare Wellness Visit. I appreciate your ongoing commitment to your health goals. Please review the following plan we discussed and let me know if I can assist you in the future.   Screening recommendations/referrals: Colonoscopy: done 10/15/18. Repeat 10/2023 Recommended yearly ophthalmology/optometry visit for glaucoma screening and checkup Recommended yearly dental visit for hygiene and checkup  Vaccinations: Influenza vaccine: done 08/09/21 Pneumococcal vaccine: done 11/22/18 Tdap vaccine: done 10/15/16 Shingles vaccine: done 08/26/19 & 10/31/19   Covid-19: done 12/13/19, 01/03/20, 08/14/20 & 08/09/21  Advanced directives: Please bring a copy of your health care power of attorney and living will to the office at your convenience once you have completed those documents  Conditions/risks identified: Keep up the great work!  Next appointment: Follow up in one year for your annual wellness visit.   Preventive Care 70 Years and Older, Male Preventive care refers to lifestyle choices and visits with your health care provider that can promote health and wellness. What does preventive care include? A yearly physical exam. This is also called an annual well check. Dental exams once or twice a year. Routine eye exams. Ask your health care provider how often you should have your eyes checked. Personal lifestyle choices, including: Daily care of your teeth and gums. Regular physical activity. Eating a healthy diet. Avoiding tobacco and drug use. Limiting alcohol use. Practicing safe sex. Taking low doses of aspirin every day. Taking vitamin and mineral supplements as recommended by your health care provider. What happens during an annual well check? The services and screenings done by your health care provider during your annual well check will depend on your age, overall health, lifestyle risk factors, and family history of  disease. Counseling  Your health care provider may ask you questions about your: Alcohol use. Tobacco use. Drug use. Emotional well-being. Home and relationship well-being. Sexual activity. Eating habits. History of falls. Memory and ability to understand (cognition). Work and work Statistician. Screening  You may have the following tests or measurements: Height, weight, and BMI. Blood pressure. Lipid and cholesterol levels. These may be checked every 5 years, or more frequently if you are over 4 years old. Skin check. Lung cancer screening. You may have this screening every year starting at age 25 if you have a 30-pack-year history of smoking and currently smoke or have quit within the past 15 years. Fecal occult blood test (FOBT) of the stool. You may have this test every year starting at age 34. Flexible sigmoidoscopy or colonoscopy. You may have a sigmoidoscopy every 5 years or a colonoscopy every 10 years starting at age 31. Prostate cancer screening. Recommendations will vary depending on your family history and other risks. Hepatitis C blood test. Hepatitis B blood test. Sexually transmitted disease (STD) testing. Diabetes screening. This is done by checking your blood sugar (glucose) after you have not eaten for a while (fasting). You may have this done every 1-3 years. Abdominal aortic aneurysm (AAA) screening. You may need this if you are a current or former smoker. Osteoporosis. You may be screened starting at age 44 if you are at high risk. Talk with your health care provider about your test results, treatment options, and if necessary, the need for more tests. Vaccines  Your health care provider may recommend certain vaccines, such as: Influenza vaccine. This is recommended every year. Tetanus, diphtheria, and acellular pertussis (Tdap, Td) vaccine. You may need a Td booster every 10 years. Zoster  vaccine. You may need this after age 86. Pneumococcal 13-valent  conjugate (PCV13) vaccine. One dose is recommended after age 52. Pneumococcal polysaccharide (PPSV23) vaccine. One dose is recommended after age 63. Talk to your health care provider about which screenings and vaccines you need and how often you need them. This information is not intended to replace advice given to you by your health care provider. Make sure you discuss any questions you have with your health care provider. Document Released: 11/16/2015 Document Revised: 07/09/2016 Document Reviewed: 08/21/2015 Elsevier Interactive Patient Education  2017 [place] Prevention in the Home Falls can cause injuries. They can happen to people of all ages. There are many things you can do to make your home safe and to help prevent falls. What can I do on the outside of my home? Regularly fix the edges of walkways and driveways and fix any cracks. Remove anything that might make you trip as you walk through a door, such as a raised step or threshold. Trim any bushes or trees on the path to your home. Use bright outdoor lighting. Clear any walking paths of anything that might make someone trip, such as rocks or tools. Regularly check to see if handrails are loose or broken. Make sure that both sides of any steps have handrails. Any raised decks and porches should have guardrails on the edges. Have any leaves, snow, or ice cleared regularly. Use sand or salt on walking paths during winter. Clean up any spills in your garage right away. This includes oil or grease spills. What can I do in the bathroom? Use night lights. Install grab bars by the toilet and in the tub and shower. Do not use towel bars as grab bars. Use non-skid mats or decals in the tub or shower. If you need to sit down in the shower, use a plastic, non-slip stool. Keep the floor dry. Clean up any water that spills on the floor as soon as it happens. Remove soap buildup in the tub or shower regularly. Attach bath mats  securely with double-sided non-slip rug tape. Do not have throw rugs and other things on the floor that can make you trip. What can I do in the bedroom? Use night lights. Make sure that you have a light by your bed that is easy to reach. Do not use any sheets or blankets that are too big for your bed. They should not hang down onto the floor. Have a firm chair that has side arms. You can use this for support while you get dressed. Do not have throw rugs and other things on the floor that can make you trip. What can I do in the kitchen? Clean up any spills right away. Avoid walking on wet floors. Keep items that you use a lot in easy-to-reach places. If you need to reach something above you, use a strong step stool that has a grab bar. Keep electrical cords out of the way. Do not use floor polish or wax that makes floors slippery. If you must use wax, use non-skid floor wax. Do not have throw rugs and other things on the floor that can make you trip. What can I do with my stairs? Do not leave any items on the stairs. Make sure that there are handrails on both sides of the stairs and use them. Fix handrails that are broken or loose. Make sure that handrails are as long as the stairways. Check any carpeting to make sure that it  is firmly attached to the stairs. Fix any carpet that is loose or worn. Avoid having throw rugs at the top or bottom of the stairs. If you do have throw rugs, attach them to the floor with carpet tape. Make sure that you have a light switch at the top of the stairs and the bottom of the stairs. If you do not have them, ask someone to add them for you. What else can I do to help prevent falls? Wear shoes that: Do not have high heels. Have rubber bottoms. Are comfortable and fit you well. Are closed at the toe. Do not wear sandals. If you use a stepladder: Make sure that it is fully opened. Do not climb a closed stepladder. Make sure that both sides of the stepladder  are locked into place. Ask someone to hold it for you, if possible. Clearly mark and make sure that you can see: Any grab bars or handrails. First and last steps. Where the edge of each step is. Use tools that help you move around (mobility aids) if they are needed. These include: Canes. Walkers. Scooters. Crutches. Turn on the lights when you go into a dark area. Replace any light bulbs as soon as they burn out. Set up your furniture so you have a clear path. Avoid moving your furniture around. If any of your floors are uneven, fix them. If there are any pets around you, be aware of where they are. Review your medicines with your doctor. Some medicines can make you feel dizzy. This can increase your chance of falling. Ask your doctor what other things that you can do to help prevent falls. This information is not intended to replace advice given to you by your health care provider. Make sure you discuss any questions you have with your health care provider. Document Released: 08/16/2009 Document Revised: 03/27/2016 Document Reviewed: 11/24/2014 Elsevier Interactive Patient Education  2017 [employer].

## 2021-12-23 NOTE — Progress Notes (Incomplete)
12/23/21 12:36 PM   Anthony Foley 05-Nov-1950 [id number]  Referring provider:  Glean Hess, [address] [place],  [place] [address] No chief complaint on file.    HPI: Anthony Foley is a 71 y.o.male with a personal history of microscopic hematuria and stricture of bulbous who presents today for further evaluation of mass in testicles.  Cystoscopy on 07/2020 retroflexion showed slight hyperemia along the bladder neck but otherwise no median lobe.    PMH: Past Medical History:  Diagnosis Date   Hyperlipidemia    Shoulder injury     Surgical History: Past Surgical History:  Procedure Laterality Date   CHOLECYSTECTOMY  1986   COLONOSCOPY  2007   divert- [facility]   COLONOSCOPY WITH PROPOFOL N/A 10/15/2018   Procedure: COLONOSCOPY WITH PROPOFOL;  Surgeon: Lucilla Lame, MD;  Location: [facility];  Service: Endoscopy;  Laterality: N/A;   HERNIA REPAIR  2008   right inguinal   KNEE ARTHROSCOPY     POLYPECTOMY  10/15/2018   Procedure: POLYPECTOMY;  Surgeon: Lucilla Lame, MD;  Location: [facility];  Service: Endoscopy;;   SHOULDER SURGERY     TONSILLECTOMY      Home Medications:  Allergies as of 12/24/2021   No Known Allergies      Medication List        Accurate as of December 23, 2021 12:36 PM. If you have any questions, ask your nurse or doctor.          cetirizine 10 MG tablet Commonly known as: ZYRTEC Take 1 tablet (10 mg total) by mouth daily as needed for allergies.   ibuprofen 200 MG tablet Commonly known as: ADVIL Take by mouth every 6 (six) hours as needed.   rosuvastatin 5 MG tablet Commonly known as: CRESTOR TAKE 1 TABLET BY MOUTH EVERY DAY   Vitamin D-3 25 MCG (1000 UT) Caps Take 1 capsule (1,000 Units total) by mouth daily.        Allergies: No Known Allergies  Family History: Family History  Problem Relation Age of Onset   Yves Dill Parkinson White syndrome Mother    Heart disease  Father 78       lived to 62   Uterine cancer Sister    Birth defects Paternal Grandfather    COPD Maternal Aunt     Social History:  reports that he has never smoked. He has never used smokeless tobacco. He reports that he does not currently use alcohol. He reports that he does not use drugs.   Physical Exam: There were no vitals taken for this visit.  Constitutional:  Alert and oriented, No acute distress. HEENT: [place] AT, moist mucus membranes.  Trachea midline, no masses. Cardiovascular: No clubbing, cyanosis, or edema. Respiratory: Normal respiratory effort, no increased work of breathing. GU: No CVA tenderness Skin: No rashes, bruises or suspicious lesions. Neurologic: Grossly intact, no focal deficits, moving all 4 extremities. Psychiatric: Normal mood and affect.  Laboratory Data: Lab Results  Component Value Date   CREATININE 1.08 10/01/2021   Lab Results  Component Value Date   HGBA1C 5.4 10/01/2021    Urinalysis   Pertinent Imaging:    Assessment & Plan:     No follow-ups on file.  I,Kailey Littlejohn,acting as a Education administrator for Hollice Espy, MD.,have documented all relevant documentation on the behalf of Hollice Espy, MD,as directed by  Hollice Espy, MD while in the presence of Hollice Espy, [place] [phone]  [address] [place], [place] [address] [phone]'

## 2021-12-24 ENCOUNTER — Ambulatory Visit: Payer: PPO | Admitting: Urology

## 2021-12-26 NOTE — Progress Notes (Incomplete)
12/26/21 3:27 PM   [name] 12-03-1950 [id number]  Referring provider:  Glean Hess, MD [address] [place],  [place] [address] No chief complaint on file.    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     PMH: Past Medical History:  Diagnosis Date   Hyperlipidemia    Shoulder injury     Surgical History: Past Surgical History:  Procedure Laterality Date   CHOLECYSTECTOMY  1986   COLONOSCOPY  2007   divert- [facility]   COLONOSCOPY WITH PROPOFOL N/A 10/15/2018   Procedure: COLONOSCOPY WITH PROPOFOL;  Surgeon: Lucilla Lame, MD;  Location: [facility];  Service: Endoscopy;  Laterality: N/A;   HERNIA REPAIR  2008   right inguinal   KNEE ARTHROSCOPY     POLYPECTOMY  10/15/2018   Procedure: POLYPECTOMY;  Surgeon: Lucilla Lame, MD;  Location: [facility];  Service: Endoscopy;;   SHOULDER SURGERY     TONSILLECTOMY      Home Medications:  Allergies as of 12/27/2021   No Known Allergies      Medication List        Accurate as of December 26, 2021  3:27 PM. If you have any questions, ask your nurse or doctor.          cetirizine 10 MG tablet Commonly known as: ZYRTEC Take 1 tablet (10 mg total) by mouth daily as needed for allergies.   ibuprofen 200 MG tablet Commonly known as: ADVIL Take by mouth every 6 (six) hours as needed.   rosuvastatin 5 MG tablet Commonly known as: CRESTOR TAKE 1 TABLET BY MOUTH EVERY DAY   Vitamin D-3 25 MCG (1000 UT) Caps Take 1 capsule (1,000 Units total) by mouth daily.        Allergies: No Known Allergies  Family History: Family History  Problem Relation Age of Onset   Yves Dill  Parkinson White syndrome Mother    Heart disease Father 29       lived to 84   Uterine cancer Sister    Birth defects Paternal Grandfather    COPD Maternal Aunt     Social History:  reports that he has never smoked. He has never used smokeless tobacco. He reports that he does not currently use alcohol. He reports that he does not use drugs.   Physical Exam: There were no vitals taken for this visit.  Constitutional:  Alert and oriented, No acute distress. HEENT: [place] AT, moist mucus membranes.  Trachea midline, no masses. Cardiovascular: No clubbing, cyanosis, or edema. Respiratory: Normal respiratory effort, no increased work of breathing. Skin: No rashes, bruises or suspicious lesions. Neurologic: Grossly intact, no focal deficits, moving all 4 extremities. Psychiatric: Normal mood and affect.  Laboratory Data: Lab Results  Component Value Date   CREATININE 1.08 10/01/2021   Lab Results  Component Value Date   HGBA1C 5.4 10/01/2021    Urinalysis   Pertinent Imaging:    Assessment & Plan:     No follow-ups on file.  I,Kailey Littlejohn,acting as a Education administrator for Hollice Espy, MD.,have documented all relevant documentation on the behalf of Hollice Espy, MD,as directed by  Hollice Espy, MD while in the presence  of Hollice Espy, [place] [address] [place], [place] [address] [phone]

## 2021-12-27 ENCOUNTER — Ambulatory Visit: Payer: PPO | Admitting: Urology

## 2022-06-21 ENCOUNTER — Encounter: Payer: Self-pay | Admitting: Emergency Medicine

## 2022-06-21 ENCOUNTER — Other Ambulatory Visit: Payer: Self-pay

## 2022-06-21 ENCOUNTER — Ambulatory Visit
Admission: EM | Admit: 2022-06-21 | Discharge: 2022-06-21 | Disposition: A | Discharge status: Home / Self Care | Payer: PPO | Attending: Physician Assistant | Admitting: Physician Assistant

## 2022-06-21 DIAGNOSIS — S0181XA Laceration without foreign body of other part of head, initial encounter: Secondary | ICD-10-CM | POA: Diagnosis not present

## 2022-06-21 DIAGNOSIS — S0990XA Unspecified injury of head, initial encounter: Secondary | ICD-10-CM

## 2022-06-21 NOTE — Discharge Instructions (Signed)
LACERATION CARE: To not get the area wet for 48 hours and then clean very gently with soap and water.  May take Tylenol for discomfort.  May ice the area but make sure to wrap the cloths that the area does not get wet.  Will cover for any signs of infection which would include increased redness, swelling, pustular drainage and return to our department for signs of infection.  Return in 10 days to have the sutures removed.  HEAD INJURY: Your neurological exam was normal today but as discussed I cannot 100% rule out a internal bleed.  It is very important that you have someone monitor your condition of the next 24 to 48 hours.  You should go to the emergency department for any red flags. Some red flags include; increased confusion, memory loss, lethargy, vision changes, change in pupil sizes, nausea and vomiting, balance difficulty, extremity weakness, numbness/tingling/burning, severe headache or neck pain.  If any of those occur you need a CT scan immediately.

## 2022-06-21 NOTE — ED Triage Notes (Signed)
Pt has laceration on his forehead. He states s tree limb hit him in the head. Denies LOC, headache or dizziness. Occurred about an hour ago.

## 2022-06-21 NOTE — ED Provider Notes (Addendum)
[facility]    CSN: [id number] Arrival date & time: 06/21/22  1225      History   Chief Complaint Chief Complaint  Patient presents with   Head Laceration    HPI Anthony Foley is a 71 y.o. male presenting for laceration of forehead.  Patient reports that he was trying to cut a tree limb and it went the opposite direction and scraped past his head.  He reports this happened a couple of hours ago.  He says he did not fall down and did not lose consciousness.  He says the area is a little tender but he does not have any headaches, dizziness, nausea/vomiting.  He is denying any confusion, speech or balance problems.  Denies neck pain or limitation in range of motion.  He denies any excessive bleeding.  Says that he believes the bleeding is stopped and he now has a Band-Aid over his head.  He believes he is up-to-date with his immunizations.  He is not taking any anticoagulant medications.  HPI  Past Medical History:  Diagnosis Date   Hyperlipidemia    Shoulder injury     Patient Active Problem List   Diagnosis Date Noted   Benign microscopic hematuria 09/24/2020   Benign neoplasm of transverse colon    Pain of left hip joint 09/21/2018   Partial tear of left rotator cuff 04/21/2017   Nontraumatic rupture of long head of biceps tendon 04/21/2017   Osteoarthritis of knee 04/21/2017   Allergic rhinitis 04/21/2017   Vitamin D deficiency, unspecified 06/25/2015   Overweight (BMI 25.0-29.9) 06/08/2015   Mixed hyperlipidemia 06/08/2015    Past Surgical History:  Procedure Laterality Date   CHOLECYSTECTOMY  1986   COLONOSCOPY  2007   divert- [place] [facility]   COLONOSCOPY WITH PROPOFOL N/A 10/15/2018   Procedure: COLONOSCOPY WITH PROPOFOL;  Surgeon: Lucilla Lame, MD;  Location: [place];  Service: Endoscopy;  Laterality: N/A;   HERNIA REPAIR  2008   right inguinal   KNEE ARTHROSCOPY     POLYPECTOMY  10/15/2018   Procedure: POLYPECTOMY;  Surgeon: Lucilla Lame, MD;  Location: [place];  Service: Endoscopy;;   SHOULDER SURGERY     TONSILLECTOMY         Home Medications    Prior to Admission medications   Medication Sig Start Date End Date Taking? Authorizing Provider  cetirizine (ZYRTEC) 10 MG tablet Take 1 tablet (10 mg total) by mouth daily as needed for allergies. 10/21/17  Yes [name], Gwyndolyn Saxon, MD  Cholecalciferol (VITAMIN D-3) 1000 units CAPS Take 1 capsule (1,000 Units total) by mouth daily. 10/22/17  Yes [name], Gwyndolyn Saxon, MD  rosuvastatin (CRESTOR) 5 MG tablet TAKE 1 TABLET BY MOUTH EVERY DAY 12/18/21  Yes Glean Hess, MD  ibuprofen (ADVIL,MOTRIN) 200 MG tablet Take by mouth every 6 (six) hours as needed.    [provider]    Family History Family History  Problem Relation Age of Onset   Yves Dill Parkinson White syndrome Mother    Heart disease Father 4       lived to 48   Uterine cancer Sister    Birth defects Paternal Grandfather    COPD Maternal Aunt     Social History Social History   Tobacco Use   Smoking status: Never   Smokeless tobacco: Never  Vaping Use   Vaping Use: Never used  Substance Use Topics   Alcohol use: Not Currently    Alcohol/week: 0.0 standard drinks of alcohol  Comment: once monthly- rare   Drug use: No     Allergies   Patient has no known allergies.   Review of Systems Review of Systems  Constitutional:  Negative for fatigue.  Eyes:  Negative for photophobia and visual disturbance.  Gastrointestinal:  Negative for nausea and vomiting.  Musculoskeletal:  Negative for gait problem, neck pain and neck stiffness.  Skin:  Positive for wound. Negative for color change.  Neurological:  Positive for headaches (mild, where laceration is). Negative for dizziness, syncope, speech difficulty, weakness, light-headedness and numbness.  Psychiatric/Behavioral:  Negative for confusion. The patient is not nervous/anxious.      Physical Exam Triage Vital Signs ED  Triage Vitals  Enc Vitals Group     BP --      Pulse --      Resp --      Temp --      Temp src --      SpO2 --      Weight 06/21/22 1242 197 lb 15.6 oz (89.8 kg)     Height 06/21/22 1242 6' (1.829 m)     Head Circumference --      Peak Flow --      Pain Score 06/21/22 1241 2     Pain Loc --      Pain Edu? --      Excl. in [place]? --    No data found.  Updated Vital Signs BP (!) 117/90 (BP Location: Left Arm)   Pulse 80   Temp 98 F (36.7 C) (Oral)   Resp 16   Ht 6' (1.829 m)   Wt 197 lb 15.6 oz (89.8 kg)   SpO2 100%   BMI 26.85 kg/m   Physical Exam Vitals and nursing note reviewed.  Constitutional:      General: He is not in acute distress.    Appearance: Normal appearance. He is well-developed. He is not ill-appearing.  HENT:     Head:     Comments: 4.5 cm laceration on forehead.  There is an area of swelling around the laceration.  Bleeding is controlled.    Right Ear: Tympanic membrane, ear canal and external ear normal.     Left Ear: Tympanic membrane, ear canal and external ear normal.     Nose: Nose normal.     Mouth/Throat:     Mouth: Mucous membranes are moist.     Pharynx: Oropharynx is clear.  Eyes:     General: No scleral icterus.    Extraocular Movements: Extraocular movements intact.     Conjunctiva/sclera: Conjunctivae normal.     Pupils: Pupils are equal, round, and reactive to light.  Cardiovascular:     Rate and Rhythm: Normal rate and regular rhythm.     Heart sounds: Normal heart sounds.  Pulmonary:     Effort: Pulmonary effort is normal. No respiratory distress.     Breath sounds: Normal breath sounds.  Abdominal:     Palpations: Abdomen is soft.     Tenderness: There is no abdominal tenderness.  Musculoskeletal:     Cervical back: Normal range of motion and neck supple. No tenderness.  Skin:    General: Skin is warm and dry.     Capillary Refill: Capillary refill takes less than 2 seconds.  Neurological:     General: No focal deficit  present.     Mental Status: He is alert and oriented to person, place, and time. Mental status is at baseline.  Motor: No weakness.     Coordination: Coordination normal.     Gait: Gait normal.  Psychiatric:        Mood and Affect: Mood normal.        Behavior: Behavior normal.      UC Treatments / Results  Labs (all labs ordered are listed, but only abnormal results are displayed) Labs Reviewed - No data to display  EKG   Radiology No results found.  Procedures Laceration Repair  Date/Time: 06/21/2022 3:31 PM  Performed by: Danton Clap, PA-C Authorized by: Danton Clap, PA-C   Consent:    Consent obtained:  Verbal   Consent given by:  Patient   Risks discussed:  Infection, pain, retained foreign body, poor cosmetic result and poor wound healing Anesthesia:    Anesthesia method:  Local infiltration   Local anesthetic:  Lidocaine 1% w/o epi Laceration details:    Location:  Face   Face location:  Forehead   Length (cm):  4.5   Depth (mm):  4 Pre-procedure details:    Preparation:  Patient was prepped and draped in usual sterile fashion Exploration:    Limited defect created (wound extended): yes     Hemostasis achieved with:  Direct pressure   Imaging outcome: foreign body not noted     Wound exploration: entire depth of wound visualized     Contaminated: no   Treatment:    Area cleansed with:  Saline   Amount of cleaning:  Extensive (debris of laceration consistent with dirt)   Irrigation solution:  Sterile saline   Visualized foreign bodies/material removed: no     Debridement:  Minimal   Undermining:  None Skin repair:    Repair method:  Sutures   Suture size:  4-0   Suture technique:  Simple interrupted   Number of sutures:  10 Approximation:    Approximation:  Close Repair type:    Repair type:  Simple Post-procedure details:    Dressing:  Non-adherent dressing   Procedure completion:  Tolerated well, no immediate complications   (including critical care time)  Medications Ordered in UC Medications - No data to display  Initial Impression / Assessment and Plan / UC Course  I have reviewed the triage vital signs and the nursing notes.  Pertinent labs & imaging results that were available during my care of the patient were reviewed by me and considered in my medical decision making (see chart for details).   71 year old male presenting for laceration of forehead and head injury which occurred a couple of hours ago.  He was hit with a tree limb.  He denies falling down or losing consciousness.  He says he branch just scraped his forehead.  He does have a 4.5 cm laceration which is relatively deep.  He denies any associated severe headaches, vision changes, dizziness, nausea/vomiting, speech or balance problems, confusion, neck pain.  I had a discussion with patient about repairing the laceration here and him monitoring his condition at home and going to the ER for any red flag signs and symptoms which would indicate that he needed a CT or just going to the emergency department at this time to have CT and laceration repair.  Advised him that I cannot 100% rule out an intracranial bleed.  He does have a normal neurological exam today and no red flag signs or symptoms.  He says that he would like to not go to the emergency department at this time.  I thoroughly discussed  ED precautions with him.  Laceration repair performed.  I had to flush the wound multiple times to clean out some dirt.  Laceration repaired with 10 simple interrupted sutures.  He tolerated well.  Advised he may ice the area to help with swelling and take Tylenol for discomfort.  Again, thoroughly reviewed ED precautions with him and advised him to return in 10 days for removal of sutures or sooner for any signs of infection.   Final Clinical Impressions(s) / UC Diagnoses   Final diagnoses:  Laceration of forehead, initial encounter  Minor head injury, initial  encounter     Discharge Instructions      LACERATION CARE: To not get the area wet for 48 hours and then clean very gently with soap and water.  May take Tylenol for discomfort.  May ice the area but make sure to wrap the cloths that the area does not get wet.  Will cover for any signs of infection which would include increased redness, swelling, pustular drainage and return to our department for signs of infection.  Return in 10 days to have the sutures removed.  HEAD INJURY: Your neurological exam was normal today but as discussed I cannot 100% rule out a internal bleed.  It is very important that you have someone monitor your condition of the next 24 to 48 hours.  You should go to the emergency department for any red flags. Some red flags include; increased confusion, memory loss, lethargy, vision changes, change in pupil sizes, nausea and vomiting, balance difficulty, extremity weakness, numbness/tingling/burning, severe headache or neck pain.  If any of those occur you need a CT scan immediately.     ED Prescriptions   None    PDMP not reviewed this encounter.   Danton Clap, PA-C 06/21/22 1536    Laurene Footman B, PA-C 06/21/22 1536

## 2022-06-23 ENCOUNTER — Ambulatory Visit
Admission: EM | Admit: 2022-06-23 | Discharge: 2022-06-23 | Disposition: A | Discharge status: Home / Self Care | Payer: PPO | Attending: Emergency Medicine | Admitting: Emergency Medicine

## 2022-06-23 DIAGNOSIS — R220 Localized swelling, mass and lump, head: Secondary | ICD-10-CM

## 2022-06-23 DIAGNOSIS — S0181XD Laceration without foreign body of other part of head, subsequent encounter: Secondary | ICD-10-CM

## 2022-06-23 MED ORDER — AMOXICILLIN-POT CLAVULANATE 875-125 MG PO TABS: 1.0000 | ORAL_TABLET | Freq: Two times a day (BID) | ORAL | 0 refills | Status: DC

## 2022-06-23 NOTE — Discharge Instructions (Signed)
I am sending you home with Augmentin 7 days in case you start to develop any signs of infection.  Give the antibiotics 24 hours to work, and if it does not respond to the antibiotics within that time,  seek medical attention at the nearest ER.  Your forehead and eye is much as you can and try lying down is much as you can for the next day or 2.  This should help with the swelling.

## 2022-06-23 NOTE — ED Provider Notes (Signed)
HPI  SUBJECTIVE:  Anthony Foley is a 71 y.o. male who presents with nontender swelling over the nasal bridge and superior to the left medial eye starting this morning. He sustained a 4.5 cm laceration on a tree branch 2 days ago, received 10 stitches here.  No fevers, purulent drainage, surrounding erythema, pain at the laceration site.  He has kept the dressing that was placed here clean, dry and intact.  He has no past medical history.  He is concerned, because he is going out of town later this week, and will be in an area where the closest medical facility is over an hour and a half away.   Past Medical History:  Diagnosis Date   Hyperlipidemia    Shoulder injury     Past Surgical History:  Procedure Laterality Date   CHOLECYSTECTOMY  1986   COLONOSCOPY  2007   divert- [place] [facility]   COLONOSCOPY WITH PROPOFOL N/A 10/15/2018   Procedure: COLONOSCOPY WITH PROPOFOL;  Surgeon: Lucilla Lame, MD;  Location: [place];  Service: Endoscopy;  Laterality: N/A;   HERNIA REPAIR  2008   right inguinal   KNEE ARTHROSCOPY     POLYPECTOMY  10/15/2018   Procedure: POLYPECTOMY;  Surgeon: Lucilla Lame, MD;  Location: [place];  Service: Endoscopy;;   SHOULDER SURGERY     TONSILLECTOMY      Family History  Problem Relation Age of Onset   Yves Dill Parkinson White syndrome Mother    Heart disease Father 22       lived to 80   Uterine cancer Sister    Birth defects Paternal Grandfather    COPD Maternal Aunt     Social History   Tobacco Use   Smoking status: Never   Smokeless tobacco: Never  Vaping Use   Vaping Use: Never used  Substance Use Topics   Alcohol use: Not Currently    Alcohol/week: 0.0 standard drinks of alcohol    Comment: once monthly- rare   Drug use: No    No current facility-administered medications for this encounter.  Current Outpatient Medications:    amoxicillin-clavulanate (AUGMENTIN) 875-125 MG tablet, Take 1 tablet by mouth every 12  (twelve) hours., Disp: 14 tablet, Rfl: 0   cetirizine (ZYRTEC) 10 MG tablet, Take 1 tablet (10 mg total) by mouth daily as needed for allergies., Disp: 30 tablet, Rfl: 11   Cholecalciferol (VITAMIN D-3) 1000 units CAPS, Take 1 capsule (1,000 Units total) by mouth daily., Disp: 60 capsule, Rfl:    ibuprofen (ADVIL,MOTRIN) 200 MG tablet, Take by mouth every 6 (six) hours as needed., Disp: , Rfl:    rosuvastatin (CRESTOR) 5 MG tablet, TAKE 1 TABLET BY MOUTH EVERY DAY, Disp: 90 tablet, Rfl: 1  No Known Allergies   ROS  As noted in HPI.   Physical Exam  BP (S) (!) 148/105 (BP Location: Left Arm)   Pulse 65   Temp 98.4 F (36.9 C) (Oral)   Ht 6' (1.829 m)   Wt 89.9 kg   SpO2 97%   BMI 26.88 kg/m   Constitutional: Well developed, well nourished, no acute distress Eyes:  EOMI, conjunctiva normal bilaterally.  Positive nontender swelling superior medial left eye HENT: Normocephalic, healing 4.5 cm laceration with 10 sutures intact.  Positive surrounding bruising.  No tenderness, erythema, increased temperature, expressible purulent drainage.  Positive nontender swelling nasal bridge      Respiratory: Normal inspiratory effort Cardiovascular: Normal rate GI: nondistended skin: No rash, skin intact Musculoskeletal: no  deformities Neurologic: Alert & oriented x 3, no focal neuro deficits Psychiatric: Speech and behavior appropriate   ED Course   Medications - No data to display  No orders of the defined types were placed in this encounter.   No results found for this or any previous visit (from the past 24 hour(s)). No results found.  ED Clinical Impression  1. Facial swelling   2. Laceration of forehead, subsequent encounter      ED Assessment/Plan  Previous records reviewed.  As noted in HPI.  There does not seem to be any evidence of infection.  Rather I suspect dependent edema.  However, he is going out of town, where he has limited access to medical care.  will  send home with Augmentin 7 days in case he starts to develop any signs of infection.  Advised him to give it 24 hours, and if it does not respond to the antibiotics within that time, he is to seek medical attention at the nearest ER.  In the meantime, ice.  Discussed MDM, treatment plan, and plan for follow-up with patient. Discussed sn/sx that should prompt return to the ED. patient agrees with plan.   Meds ordered this encounter  Medications   amoxicillin-clavulanate (AUGMENTIN) 875-125 MG tablet    Sig: Take 1 tablet by mouth every 12 (twelve) hours.    Dispense:  14 tablet    Refill:  0      *This clinic note was created using [employer]. Therefore, there may be occasional mistakes despite careful proofreading.  ?    Melynda Ripple, MD 06/24/22 [phone]

## 2022-06-23 NOTE — ED Triage Notes (Signed)
Patient reports that he is going out of town and wanted to get rechecked.   Patient was here 06/21/2022 for a forehead laceration.   Patient reports that this AM swelling started at the top left of his nose.

## 2022-06-26 ENCOUNTER — Other Ambulatory Visit: Payer: Self-pay | Admitting: Internal Medicine

## 2022-06-26 DIAGNOSIS — E782 Mixed hyperlipidemia: Secondary | ICD-10-CM

## 2022-06-26 NOTE — Telephone Encounter (Signed)
Requested Prescriptions  Pending Prescriptions Disp Refills  . rosuvastatin (CRESTOR) 5 MG tablet [Pharmacy Med Name: ROSUVASTATIN CALCIUM 5 MG TAB] 90 tablet 1    Sig: TAKE 1 TABLET BY MOUTH EVERY DAY     Cardiovascular:  Antilipid - Statins 2 Failed - 06/26/2022  2:11 AM      Failed - Lipid Panel in normal range within the last 12 months    Cholesterol, Total  Date Value Ref Range Status  10/01/2021 168 100 - 199 mg/dL Final   LDL Chol Calc (NIH)  Date Value Ref Range Status  10/01/2021 90 0 - 99 mg/dL Final   HDL  Date Value Ref Range Status  10/01/2021 52 >39 mg/dL Final   Triglycerides  Date Value Ref Range Status  10/01/2021 147 0 - 149 mg/dL Final         Passed - Cr in normal range and within 360 days    Creatinine, Ser  Date Value Ref Range Status  10/01/2021 1.08 0.76 - 1.27 mg/dL Final         Passed - Patient is not pregnant      Passed - Valid encounter within last 12 months    Recent Outpatient Visits          8 months ago Annual physical exam   [place] [facility] at [facility], Jesse Sans, MD   1 year ago Annual physical exam   [place] [facility] at [facility], Jesse Sans, MD   2 years ago Annual physical exam   [place] [facility] at [facility], Jesse Sans, MD   3 years ago Mixed hyperlipidemia    Primary Care and Sports Medicine at [facility], Jesse Sans, MD   3 years ago Annual physical exam   [facility] at [facility], Jesse Sans, MD      Future Appointments            In 3 months Army Melia, Jesse Sans, MD [place] [facility] at [facility], [facility]

## 2022-06-30 ENCOUNTER — Ambulatory Visit
Admission: RE | Admit: 2022-06-30 | Discharge: 2022-06-30 | Disposition: A | Discharge status: Home / Self Care | Payer: PPO | Source: Ambulatory Visit | Attending: Internal Medicine | Admitting: Internal Medicine

## 2022-06-30 ENCOUNTER — Other Ambulatory Visit: Payer: Self-pay

## 2022-06-30 VITALS — BP 123/95 | HR 67 | Temp 98.2°F | Resp 16

## 2022-06-30 DIAGNOSIS — T148XXA Other injury of unspecified body region, initial encounter: Secondary | ICD-10-CM

## 2022-06-30 NOTE — Discharge Instructions (Signed)
Return as needed for any concerns

## 2022-06-30 NOTE — ED Triage Notes (Signed)
Sutures placed on 06/23/2022.  Patient is here for sutures to be removed.  Edges of wound well approximated

## 2022-08-22 DIAGNOSIS — M21622 Bunionette of left foot: Secondary | ICD-10-CM | POA: Diagnosis not present

## 2022-08-22 DIAGNOSIS — M2041 Other hammer toe(s) (acquired), right foot: Secondary | ICD-10-CM | POA: Diagnosis not present

## 2022-08-22 DIAGNOSIS — M2042 Other hammer toe(s) (acquired), left foot: Secondary | ICD-10-CM | POA: Diagnosis not present

## 2022-09-24 ENCOUNTER — Other Ambulatory Visit: Payer: Self-pay | Admitting: Internal Medicine

## 2022-09-24 DIAGNOSIS — E782 Mixed hyperlipidemia: Secondary | ICD-10-CM

## 2022-09-24 NOTE — Telephone Encounter (Signed)
Requested medication (s) are due for refill today:   Yes  Requested medication (s) are on the active medication list:   Yes  Future visit scheduled:   Yes 10/03/2022    Last ordered: 06/26/2022 #90, 0 refills  Returned because labs last done 10/01/2021, Appt. Is for 10/03/2022.  Labs will be due however does Dr. Army Melia want to refill this now or wait until after physical and blood work is back?     Requested Prescriptions  Pending Prescriptions Disp Refills   rosuvastatin (CRESTOR) 5 MG tablet [Pharmacy Med Name: ROSUVASTATIN CALCIUM 5 MG TAB] 90 tablet 0    Sig: TAKE 1 TABLET BY MOUTH EVERY DAY     Cardiovascular:  Antilipid - Statins 2 Failed - 09/24/2022  2:06 AM      Failed - Lipid Panel in normal range within the last 12 months    Cholesterol, Total  Date Value Ref Range Status  10/01/2021 168 100 - 199 mg/dL Final   LDL Chol Calc (NIH)  Date Value Ref Range Status  10/01/2021 90 0 - 99 mg/dL Final   HDL  Date Value Ref Range Status  10/01/2021 52 >39 mg/dL Final   Triglycerides  Date Value Ref Range Status  10/01/2021 147 0 - 149 mg/dL Final         Passed - Cr in normal range and within 360 days    Creatinine, Ser  Date Value Ref Range Status  10/01/2021 1.08 0.76 - 1.27 mg/dL Final         Passed - Patient is not pregnant      Passed - Valid encounter within last 12 months    Recent Outpatient Visits           11 months ago Annual physical exam   [place] [facility] at [facility], Jesse Sans, MD   2 years ago Annual physical exam   [place] [facility] at [facility], Jesse Sans, MD   3 years ago Annual physical exam   [place] [facility] at [facility], Jesse Sans, MD   3 years ago Mixed hyperlipidemia   [place] [facility] at [facility], Jesse Sans, MD   4 years ago Annual physical exam   [facility] and [facility] at [facility], Jesse Sans, MD       Future Appointments             In 1 week Army Melia, Jesse Sans, MD [place] [facility] at [facility], [facility]

## 2022-10-03 ENCOUNTER — Ambulatory Visit (INDEPENDENT_AMBULATORY_CARE_PROVIDER_SITE_OTHER): Payer: PPO | Admitting: Internal Medicine

## 2022-10-03 ENCOUNTER — Encounter: Payer: Self-pay | Admitting: Internal Medicine

## 2022-10-03 VITALS — BP 128/78 | HR 69 | Ht 72.0 in | Wt 192.6 lb

## 2022-10-03 DIAGNOSIS — Z0000 Encounter for general adult medical examination without abnormal findings: Secondary | ICD-10-CM | POA: Diagnosis not present

## 2022-10-03 DIAGNOSIS — E782 Mixed hyperlipidemia: Secondary | ICD-10-CM

## 2022-10-03 DIAGNOSIS — Z23 Encounter for immunization: Secondary | ICD-10-CM

## 2022-10-03 DIAGNOSIS — Z125 Encounter for screening for malignant neoplasm of prostate: Secondary | ICD-10-CM | POA: Diagnosis not present

## 2022-10-03 DIAGNOSIS — Z791 Long term (current) use of non-steroidal anti-inflammatories (NSAID): Secondary | ICD-10-CM

## 2022-10-03 DIAGNOSIS — R9431 Abnormal electrocardiogram [ECG] [EKG]: Secondary | ICD-10-CM | POA: Diagnosis not present

## 2022-10-03 NOTE — Progress Notes (Signed)
Date:  10/03/2022   Name:  Anthony Foley   DOB:  1950-12-26   MRN:  [id number]   Chief Complaint: Annual Exam Anthony Foley is a 71 y.o. male who presents today for his Complete Annual Exam. He feels well. He reports exercising. He reports he is sleeping well.   Colonoscopy: 10/2018 repeat 5 yrs  Immunization History  Administered Date(s) Administered   Influenza, High Dose Seasonal PF 10/21/2017, 09/21/2018, 08/26/2019   Influenza,inj,Quad PF,6+ Mos 09/03/2015, 09/04/2016   Influenza-Unspecified 08/14/2020, 08/09/2021   PFIZER(Purple Top)SARS-COV-2 Vaccination 12/13/2019, 01/03/2020, 08/14/2020   Pfizer Covid-19 Vaccine Bivalent Booster 101yr & up 08/09/2021   Pneumococcal Conjugate-13 10/21/2017   Pneumococcal Polysaccharide-23 11/22/2018   Tdap 10/15/2016   Zoster Recombinat (Shingrix) 08/26/2019, 10/31/2019   Health Maintenance Due  Topic Date Due   INFLUENZA VACCINE  06/03/2022   COVID-19 Vaccine (5 - 2023-24 season) 07/04/2022    Lab Results  Component Value Date   PSA1 1.2 10/01/2021   PSA1 1.1 09/24/2020   PSA1 0.9 09/23/2019    Hyperlipidemia This is a chronic problem. The problem is controlled. Pertinent negatives include no chest pain, myalgias or shortness of breath. Current antihyperlipidemic treatment includes statins. The current treatment provides significant improvement of lipids.   Abnormal EKG - done at a study this summer. He was told it was not a dangerous finding but to let his PCP know.  The EKG is not visible in [facility]where and he did not participate in the study.  He denies CP or shortness of breath with exertion.  He exercises regularly and feels well.  He does have a family hx of Afib (brother and sister).   Lab Results  Component Value Date   NA 142 10/01/2021   K 4.1 10/01/2021   CO2 26 10/01/2021   GLUCOSE 86 10/01/2021   BUN 22 10/01/2021   CREATININE 1.08 10/01/2021   CALCIUM 9.5 10/01/2021   EGFR 74 10/01/2021    GFRNONAA 63 09/24/2020   Lab Results  Component Value Date   CHOL 168 10/01/2021   HDL 52 10/01/2021   LDLCALC 90 10/01/2021   TRIG 147 10/01/2021   CHOLHDL 3.2 10/01/2021   No results found for: "TSH" Lab Results  Component Value Date   HGBA1C 5.4 10/01/2021   Lab Results  Component Value Date   WBC 5.7 10/01/2021   HGB 15.1 10/01/2021   HCT 44.9 10/01/2021   MCV 89 10/01/2021   PLT 234 10/01/2021   Lab Results  Component Value Date   ALT 23 10/01/2021   AST 21 10/01/2021   ALKPHOS 108 10/01/2021   BILITOT 0.7 10/01/2021   Lab Results  Component Value Date   VD25OH 39.3 10/01/2021     Review of Systems  Constitutional:  Negative for appetite change, chills, diaphoresis, fatigue and unexpected weight change.  HENT:  Negative for hearing loss, tinnitus, trouble swallowing and voice change.   Eyes:  Negative for visual disturbance.  Respiratory:  Negative for choking, shortness of breath and wheezing.   Cardiovascular:  Negative for chest pain, palpitations and leg swelling.  Gastrointestinal:  Negative for abdominal pain, blood in stool, constipation and diarrhea.  Genitourinary:  Negative for difficulty urinating, dysuria and frequency.  Musculoskeletal:  Negative for arthralgias, back pain and myalgias.  Skin:  Negative for color change and rash.  Neurological:  Negative for dizziness, syncope and headaches.  Hematological:  Negative for adenopathy.  Psychiatric/Behavioral:  Negative for dysphoric mood and sleep disturbance. The  patient is not nervous/anxious.     Patient Active Problem List   Diagnosis Date Noted   Benign microscopic hematuria 09/24/2020   Benign neoplasm of transverse colon    Pain of left hip joint 09/21/2018   Partial tear of left rotator cuff 04/21/2017   Nontraumatic rupture of long head of biceps tendon 04/21/2017   Osteoarthritis of knee 04/21/2017   Allergic rhinitis 04/21/2017   Vitamin D deficiency, unspecified 06/25/2015    Overweight (BMI 25.0-29.9) 06/08/2015   Mixed hyperlipidemia 06/08/2015    No Known Allergies  Past Surgical History:  Procedure Laterality Date   CHOLECYSTECTOMY  1986   COLONOSCOPY  2007   divert- [facility]   COLONOSCOPY WITH PROPOFOL N/A 10/15/2018   Procedure: COLONOSCOPY WITH PROPOFOL;  Surgeon: Lucilla Lame, MD;  Location: [place];  Service: Endoscopy;  Laterality: N/A;   HERNIA REPAIR  2008   right inguinal   KNEE ARTHROSCOPY     POLYPECTOMY  10/15/2018   Procedure: POLYPECTOMY;  Surgeon: Lucilla Lame, MD;  Location: [place];  Service: Endoscopy;;   SHOULDER SURGERY     TONSILLECTOMY      Social History   Tobacco Use   Smoking status: Never   Smokeless tobacco: Never  Vaping Use   Vaping Use: Never used  Substance Use Topics   Alcohol use: Not Currently    Alcohol/week: 0.0 standard drinks of alcohol    Comment: once monthly- rare   Drug use: No     Medication list has been reviewed and updated.  Current Meds  Medication Sig   cetirizine (ZYRTEC) 10 MG tablet Take 1 tablet (10 mg total) by mouth daily as needed for allergies.   Cholecalciferol (VITAMIN D-3) 1000 units CAPS Take 1 capsule (1,000 Units total) by mouth daily.   ibuprofen (ADVIL,MOTRIN) 200 MG tablet Take by mouth every 6 (six) hours as needed.   rosuvastatin (CRESTOR) 5 MG tablet TAKE 1 TABLET BY MOUTH EVERY DAY       10/03/2022    9:54 AM 10/01/2021    8:06 AM 09/24/2020    7:54 AM  GAD 7 : Generalized Anxiety Score  Nervous, Anxious, on Edge 0 0 0  Control/stop worrying 0 0 0  Worry too much - different things 0 0 0  Trouble relaxing 0 0 0  Restless 0 0 0  Easily annoyed or irritable 0 0 0  Afraid - awful might happen 0 0 0  Total GAD 7 Score 0 0 0  Anxiety Difficulty Not difficult at all Not difficult at all Not difficult at all       10/03/2022    9:54 AM 12/18/2021    8:08 AM 10/01/2021    8:06 AM  Depression screen PHQ 2/9  Decreased Interest 0 0 0   Down, Depressed, Hopeless 0 0 0  PHQ - 2 Score 0 0 0  Altered sleeping 0  0  Tired, decreased energy 0  0  Change in appetite 0  0  Feeling bad or failure about yourself  0  0  Trouble concentrating 0  0  Moving slowly or fidgety/restless 0  0  Suicidal thoughts 0  0  PHQ-9 Score 0  0  Difficult doing work/chores Not difficult at all  Not difficult at all    BP Readings from Last 3 Encounters:  10/03/22 128/78  06/30/22 (!) 123/95  06/23/22 (S) (!) 148/105    Physical Exam Vitals and nursing note reviewed.  Constitutional:  Appearance: Normal appearance. He is well-developed.  HENT:     Head: Normocephalic.     Right Ear: Tympanic membrane, ear canal and external ear normal.     Left Ear: Tympanic membrane, ear canal and external ear normal.     Nose: Nose normal.  Eyes:     Conjunctiva/sclera: Conjunctivae normal.     Pupils: Pupils are equal, round, and reactive to light.  Neck:     Thyroid: No thyromegaly.     Vascular: No carotid bruit.  Cardiovascular:     Rate and Rhythm: Normal rate and regular rhythm.     Heart sounds: Normal heart sounds.  Pulmonary:     Effort: Pulmonary effort is normal.     Breath sounds: Normal breath sounds. No wheezing.  Chest:  Breasts:    Right: No mass.     Left: No mass.  Abdominal:     General: Bowel sounds are normal.     Palpations: Abdomen is soft.     Tenderness: There is no abdominal tenderness.  Musculoskeletal:        General: Normal range of motion.     Cervical back: Normal range of motion and neck supple.  Lymphadenopathy:     Cervical: No cervical adenopathy.  Skin:    General: Skin is warm and dry.     Capillary Refill: Capillary refill takes less than 2 seconds.  Neurological:     General: No focal deficit present.     Mental Status: He is alert and oriented to person, place, and time.     Deep Tendon Reflexes: Reflexes are normal and symmetric.  Psychiatric:        Attention and Perception: Attention  normal.        Mood and Affect: Mood normal.        Thought Content: Thought content normal.     Wt Readings from Last 3 Encounters:  10/03/22 192 lb 9.6 oz (87.4 kg)  06/23/22 198 lb 3.1 oz (89.9 kg)  06/21/22 197 lb 15.6 oz (89.8 kg)    BP 128/78   Pulse 69   Ht 6' (1.829 m)   Wt 192 lb 9.6 oz (87.4 kg)   BMI 26.12 kg/m   Assessment and Plan: 1. Annual physical exam Normal exam.  BP at home have been normal > 50% of the time. Continue exercise, healthy diet. Return if BP is greater than 135/85 Covid booster declined.  2. Prostate cancer screening DRE deferred to lack of symptoms  - PSA  3. Mixed hyperlipidemia Tolerating statin medication without side effects at this time LDL is at goal of < 70 on current dose Continue same therapy without change at this time. - Lipid panel  4. Encounter for long-term (current) use of NSAIDs Monitor renal function, gi SE - CBC with Differential/Platelet - Comprehensive metabolic panel  5. Abnormal finding on EKG Will obtain EKG today and advise. - EKG 12-Lead - NSR @ 61, WNL   Partially dictated using Editor, commissioning. Any errors are unintentional.  Halina Maidens, MD [facility]  10/03/2022

## 2022-10-04 LAB — CBC WITH DIFFERENTIAL/PLATELET
Basophils Absolute: 0 10*3/uL (ref 0.0–0.2)
Basos: 0 %
EOS (ABSOLUTE): 0.1 10*3/uL (ref 0.0–0.4)
Eos: 1 %
Hematocrit: 45.5 % (ref 37.5–51.0)
Hemoglobin: 15.4 g/dL (ref 13.0–17.7)
Immature Grans (Abs): 0 10*3/uL (ref 0.0–0.1)
Immature Granulocytes: 0 %
Lymphocytes Absolute: 2 10*3/uL (ref 0.7–3.1)
Lymphs: 34 %
MCH: 30.7 pg (ref 26.6–33.0)
MCHC: 33.8 g/dL (ref 31.5–35.7)
MCV: 91 fL (ref 79–97)
Monocytes Absolute: 0.4 10*3/uL (ref 0.1–0.9)
Monocytes: 7 %
Neutrophils Absolute: 3.5 10*3/uL (ref 1.4–7.0)
Neutrophils: 58 %
Platelets: 232 10*3/uL (ref 150–450)
RBC: 5.02 x10E6/uL (ref 4.14–5.80)
RDW: 13.2 % (ref 11.6–15.4)
WBC: 6 10*3/uL (ref 3.4–10.8)

## 2022-10-04 LAB — COMPREHENSIVE METABOLIC PANEL
ALT: 16 IU/L (ref 0–44)
AST: 15 IU/L (ref 0–40)
Albumin/Globulin Ratio: 2.1 (ref 1.2–2.2)
Albumin: 4.9 g/dL (ref 3.9–4.9)
Alkaline Phosphatase: 96 IU/L (ref 44–121)
BUN/Creatinine Ratio: 15 (ref 10–24)
BUN: 17 mg/dL (ref 8–27)
Bilirubin Total: 0.8 mg/dL (ref 0.0–1.2)
CO2: 22 mmol/L (ref 20–29)
Calcium: 9.9 mg/dL (ref 8.6–10.2)
Chloride: 102 mmol/L (ref 96–106)
Creatinine, Ser: 1.17 mg/dL (ref 0.76–1.27)
Globulin, Total: 2.3 g/dL (ref 1.5–4.5)
Glucose: 91 mg/dL (ref 70–99)
Potassium: 4.5 mmol/L (ref 3.5–5.2)
Sodium: 139 mmol/L (ref 134–144)
Total Protein: 7.2 g/dL (ref 6.0–8.5)
eGFR: 67 mL/min/{1.73_m2} (ref >59)

## 2022-10-04 LAB — LIPID PANEL
Chol/HDL Ratio: 2.6 ratio (ref 0.0–5.0)
Cholesterol, Total: 153 mg/dL (ref 100–199)
HDL: 59 mg/dL (ref >39)
LDL Chol Calc (NIH): 73 mg/dL (ref 0–99)
Triglycerides: 121 mg/dL (ref 0–149)
VLDL Cholesterol Cal: 21 mg/dL (ref 5–40)

## 2022-10-04 LAB — PSA: Prostate Specific Ag, Serum: 1.2 ng/mL (ref 0.0–4.0)

## 2022-10-23 ENCOUNTER — Other Ambulatory Visit: Payer: Self-pay | Admitting: Internal Medicine

## 2022-10-23 DIAGNOSIS — E782 Mixed hyperlipidemia: Secondary | ICD-10-CM

## 2022-12-22 ENCOUNTER — Ambulatory Visit: Payer: PPO

## 2022-12-25 ENCOUNTER — Ambulatory Visit (INDEPENDENT_AMBULATORY_CARE_PROVIDER_SITE_OTHER): Payer: PPO

## 2022-12-25 VITALS — Ht 72.0 in | Wt 192.0 lb

## 2022-12-25 DIAGNOSIS — Z0000 Encounter for general adult medical examination without abnormal findings: Secondary | ICD-10-CM | POA: Diagnosis not present

## 2022-12-25 NOTE — Progress Notes (Signed)
I connected with  Anthony Foley on 12/25/22 by a audio enabled telemedicine application and verified that I am speaking with the correct person using two identifiers.  Patient Location: Home  Provider Location: Office/Clinic  I discussed the limitations of evaluation and management by telemedicine. The patient expressed understanding and agreed to proceed.  Subjective:   Anthony Foley is a 72 y.o. male who presents for Medicare Annual/Subsequent preventive examination.  Review of Systems     Cardiac Risk Factors include: advanced age (>39mn, >>45women);dyslipidemia;male gender     Objective:    There were no vitals filed for this visit. There is no height or weight on file to calculate BMI.     12/25/2022    8:18 AM 06/21/2022   12:43 PM 12/18/2021    8:08 AM 12/17/2020    8:29 AM 05/08/2020   12:08 PM 09/14/2019   10:11 AM 10/15/2018    8:06 AM  Advanced Directives  Does Patient Have a Medical Advance Directive? No No No No No No No  Would patient like information on creating a medical advance directive? No - Patient declined  No - Patient declined No - Patient declined No - Patient declined Yes (MAU/Ambulatory/Procedural Areas - Information given) No - Patient declined    Current Medications (verified) Outpatient Encounter Medications as of 12/25/2022  Medication Sig   cetirizine (ZYRTEC) 10 MG tablet Take 1 tablet (10 mg total) by mouth daily as needed for allergies.   Cholecalciferol (VITAMIN D-3) 1000 units CAPS Take 1 capsule (1,000 Units total) by mouth daily.   ibuprofen (ADVIL,MOTRIN) 200 MG tablet Take by mouth every 6 (six) hours as needed.   rosuvastatin (CRESTOR) 5 MG tablet TAKE 1 TABLET BY MOUTH EVERY DAY   No facility-administered encounter medications on file as of 12/25/2022.    Allergies (verified) Patient has no known allergies.   History: Past Medical History:  Diagnosis Date   Hyperlipidemia    Shoulder injury    Past Surgical History:   Procedure Laterality Date   CHOLECYSTECTOMY  1986   COLONOSCOPY  2007   divert- [facility]   COLONOSCOPY WITH PROPOFOL N/A 10/15/2018   Procedure: COLONOSCOPY WITH PROPOFOL;  Surgeon: WLucilla Lame MD;  Location[place]  Service: Endoscopy;  Laterality: N/A;   HERNIA REPAIR  2008   right inguinal   KNEE ARTHROSCOPY     POLYPECTOMY  10/15/2018   Procedure: POLYPECTOMY;  Surgeon: WLucilla Lame MD;  [place]  Service: Endoscopy;;   SHOULDER SURGERY     TONSILLECTOMY     Family History  Problem Relation Age of Onset   WYves DillParkinson White syndrome Mother    Heart disease Father 625      lived to 814  Uterine cancer Sister    Birth defects Paternal Grandfather    COPD Maternal Aunt    Social History   Socioeconomic History   Marital status: Married    Spouse name: Not on file   Number of children: Not on file   Years of education: Not on file   Highest education level: Not on file  Occupational History   Not on file  Tobacco Use   Smoking status: Never   Smokeless tobacco: Never  Vaping Use   Vaping Use: Never used  Substance and Sexual Activity   Alcohol use: Not Currently    Alcohol/week: 0.0 standard drinks of alcohol    Comment: once monthly- rare   Drug use:  No   Sexual activity: Yes  Other Topics Concern   Not on file  Social History Narrative   Not on file   Social Determinants of Health   Financial Resource Strain: Low Risk  (12/25/2022)   Overall Financial Resource Strain (CARDIA)    Difficulty of Paying Living Expenses: Not hard at all  Food Insecurity: No Food Insecurity (12/25/2022)   Hunger Vital Sign    Worried About Running Out of Food in the Last Year: Never true    Ran Out of Food in the Last Year: Never true  Transportation Needs: No Transportation Needs (12/25/2022)   PRAPARE - Hydrologist (Medical): No    Lack of Transportation (Non-Medical): No  Physical Activity:  Sufficiently Active (12/25/2022)   Exercise Vital Sign    Days of Exercise per Week: 6 days    Minutes of Exercise per Session: 60 min  Stress: No Stress Concern Present (12/25/2022)   [place]    Feeling of Stress : Not at all  Social Connections: [place] (12/25/2022)   Social Connection and Isolation Panel [NHANES]    Frequency of Communication with Friends and Family: More than three times a week    Frequency of Social Gatherings with Friends and Family: Three times a week    Attends Religious Services: More than 4 times per year    Active Member of Clubs or Organizations: Yes    Attends Music therapist: More than 4 times per year    Marital Status: Married    Tobacco Counseling Counseling given: Not Answered   Clinical Intake:  Pre-visit preparation completed: Yes  Pain : No/denies pain     Nutritional Risks: None Diabetes: No  How often do you need to have someone help you when you read instructions, pamphlets, or other written materials from your doctor or pharmacy?: 1 - Never  Diabetic?no  Interpreter Needed?: No  Information entered by :: Kirke Shaggy, LPN   Activities of Daily Living    12/25/2022    8:19 AM  In your present state of health, do you have any difficulty performing the following activities:  Hearing? 0  Vision? 0  Difficulty concentrating or making decisions? 0  Walking or climbing stairs? 0  Dressing or bathing? 0  Doing errands, shopping? 0  Preparing Food and eating ? N  Using the Toilet? N  In the past six months, have you accidently leaked urine? N  Do you have problems with loss of bowel control? N  Managing your Medications? N  Managing your Finances? N  Housekeeping or managing your Housekeeping? N    Patient Care Team: Glean Hess, MD as PCP - General (Internal Medicine) Ree Edman, MD (Dermatology)  Indicate any  recent Medical Services you may have received from other than [facility] providers in the past year (date may be approximate).     Assessment:   This is a routine wellness examination for Anthony Foley.  Hearing/Vision screen Hearing Screening - Comments:: No aids Vision Screening - Comments:: Readers- Dr.Shade  Dietary issues and exercise activities discussed: Current Exercise Habits: Home exercise routine, Type of exercise: yoga;strength training/weights;Other - see comments (elliptical), Time (Minutes): 60, Frequency (Times/Week): 6, Weekly Exercise (Minutes/Week): 360, Exercise limited by: None identified   Goals Addressed             This Visit's Progress    DIET - EAT MORE FRUITS AND VEGETABLES  Depression Screen    12/25/2022    8:16 AM 10/03/2022    9:54 AM 12/18/2021    8:08 AM 10/01/2021    8:06 AM 12/17/2020    8:28 AM 09/24/2020    7:54 AM 09/23/2019    9:02 AM  PHQ 2/9 Scores  PHQ - 2 Score 0 0 0 0 0 0 0  PHQ- 9 Score 0 0  0  0     Fall Risk    12/25/2022    8:19 AM 10/03/2022    9:54 AM 12/18/2021    8:09 AM 10/01/2021    8:06 AM 12/17/2020    8:31 AM  Fall Risk   Falls in the past year? 0 0 0 0 0  Number falls in past yr: 0 0 0 0 0  Injury with Fall? 0 0 0 0 0  Risk for fall due to : No Fall Risks No Fall Risks No Fall Risks No Fall Risks No Fall Risks  Follow up Falls prevention discussed;Falls evaluation completed Follow up appointment Falls prevention discussed Falls evaluation completed Falls prevention discussed    FALL RISK PREVENTION PERTAINING TO THE HOME:  Any stairs in or around the home? Yes  If so, are there any without handrails? No  Home free of loose throw rugs in walkways, pet beds, electrical cords, etc? Yes  Adequate lighting in your home to reduce risk of falls? Yes   ASSISTIVE DEVICES UTILIZED TO PREVENT FALLS:  Life alert? No  Use of a cane, walker or w/c? No  Grab bars in the bathroom? Yes  Shower chair or bench in shower? Yes   Elevated toilet seat or a handicapped toilet? Yes   Cognitive Function:        12/25/2022    8:23 AM 09/14/2019   10:14 AM  6CIT Screen  What Year? 0 points 0 points  What month? 0 points 0 points  What time? 0 points 0 points  Count back from 20 0 points 0 points  Months in reverse 0 points 0 points  Repeat phrase 0 points 0 points  Total Score 0 points 0 points    Immunizations Immunization History  Administered Date(s) Administered   Fluad Quad(high Dose 65+) 10/03/2022   Influenza, High Dose Seasonal PF 10/21/2017, 09/21/2018, 08/26/2019   Influenza,inj,Quad PF,6+ Mos 09/03/2015, 09/04/2016   Influenza-Unspecified 08/14/2020, 08/09/2021   PFIZER(Purple Top)SARS-COV-2 Vaccination 12/13/2019, 01/03/2020, 08/14/2020   Pfizer Covid-19 Vaccine Bivalent Booster 20yr & up 08/09/2021   Pneumococcal Conjugate-13 10/21/2017   Pneumococcal Polysaccharide-23 11/22/2018   Tdap 10/15/2016   Zoster Recombinat (Shingrix) 08/26/2019, 10/31/2019    TDAP status: Up to date  Flu Vaccine status: Up to date  Pneumococcal vaccine status: Up to date  Covid-19 vaccine status: Completed vaccines  Qualifies for Shingles Vaccine? Yes   Zostavax completed No   Shingrix Completed?: Yes  Screening Tests Health Maintenance  Topic Date Due   COVID-19 Vaccine (5 - 2023-24 season) 07/04/2022   COLONOSCOPY (Pts 45-487yrInsurance coverage will need to be confirmed)  10/16/2023   Medicare Annual Wellness (AWV)  12/26/2023   DTaP/Tdap/Td (2 - Td or Tdap) 10/15/2026   Pneumonia Vaccine 6541Years old  Completed   INFLUENZA VACCINE  Completed   Hepatitis C Screening  Completed   Zoster Vaccines- Shingrix  Completed   HPV VACCINES  Aged Out    Health Maintenance  Health Maintenance Due  Topic Date Due   COVID-19 Vaccine (5 - 2023-24 season) 07/04/2022  Colorectal cancer screening: Type of screening: Colonoscopy. Completed 10/15/18. Repeat every 5 years  Lung Cancer Screening: (Low  Dose CT Chest recommended if Age 48-80 years, 30 pack-year currently smoking OR have quit w/in 15years.) does not qualify.   Additional Screening:  Hepatitis C Screening: does qualify; Completed 10/21/17  Vision Screening: Recommended annual ophthalmology exams for early detection of glaucoma and other disorders of the eye. Is the patient up to date with their annual eye exam?  Yes  Who is the provider or what is the name of the office in which the patient attends annual eye exams? Dr.Shade If pt is not established with a provider, would they like to be referred to a provider to establish care? No .   Dental Screening: Recommended annual dental exams for proper oral hygiene  Community Resource Referral / Chronic Care Management: CRR required this visit?  No   CCM required this visit?  No      Plan:     I have personally reviewed and noted the following in the patient's chart:   Medical and social history Use of alcohol, tobacco or illicit drugs  Current medications and supplements including opioid prescriptions. Patient is not currently taking opioid prescriptions. Functional ability and status Nutritional status Physical activity Advanced directives List of other physicians Hospitalizations, surgeries, and ER visits in previous 12 months Vitals Screenings to include cognitive, depression, and falls Referrals and appointments  In addition, I have reviewed and discussed with patient certain preventive protocols, quality metrics, and best practice recommendations. A written personalized care plan for preventive services as well as general preventive health recommendations were provided to patient.     Dionisio David, LPN   [id number]   Nurse Notes: none

## 2022-12-25 NOTE — Patient Instructions (Signed)
Anthony Foley , Thank you for taking time to come for your Medicare Wellness Visit. I appreciate your ongoing commitment to your health goals. Please review the following plan we discussed and let me know if I can assist you in the future.   These are the goals we discussed:  Goals      DIET - EAT MORE FRUITS AND VEGETABLES     Weight (lb) < 175 lb (79.4 kg)     Pt would like to lose weight over the next year with healthy eating and physical activity        This is a list of the screening recommended for you and due dates:  Health Maintenance  Topic Date Due   COVID-19 Vaccine (5 - 2023-24 season) 07/04/2022   Colon Cancer Screening  10/16/2023   Medicare Annual Wellness Visit  12/26/2023   DTaP/Tdap/Td vaccine (2 - Td or Tdap) 10/15/2026   Pneumonia Vaccine  Completed   Flu Shot  Completed   Hepatitis C Screening: USPSTF Recommendation to screen - Ages 71-79 yo.  Completed   Zoster (Shingles) Vaccine  Completed   HPV Vaccine  Aged Out    Advanced directives: no  Conditions/risks identified: no  Next appointment: Follow up in one year for your annual wellness visit. 12/30/23 @ 11:45 am by phone  Preventive Care 71 Years and Older, Male  Preventive care refers to lifestyle choices and visits with your health care provider that can promote health and wellness. What does preventive care include? A yearly physical exam. This is also called an annual well check. Dental exams once or twice a year. Routine eye exams. Ask your health care provider how often you should have your eyes checked. Personal lifestyle choices, including: Daily care of your teeth and gums. Regular physical activity. Eating a healthy diet. Avoiding tobacco and drug use. Limiting alcohol use. Practicing safe sex. Taking low doses of aspirin every day. Taking vitamin and mineral supplements as recommended by your health care provider. What happens during an annual well check? The services and screenings  done by your health care provider during your annual well check will depend on your age, overall health, lifestyle risk factors, and family history of disease. Counseling  Your health care provider may ask you questions about your: Alcohol use. Tobacco use. Drug use. Emotional well-being. Home and relationship well-being. Sexual activity. Eating habits. History of falls. Memory and ability to understand (cognition). Work and work Statistician. Screening  You may have the following tests or measurements: Height, weight, and BMI. Blood pressure. Lipid and cholesterol levels. These may be checked every 5 years, or more frequently if you are over 66 years old. Skin check. Lung cancer screening. You may have this screening every year starting at age 64 if you have a 30-pack-year history of smoking and currently smoke or have quit within the past 15 years. Fecal occult blood test (FOBT) of the stool. You may have this test every year starting at age 9. Flexible sigmoidoscopy or colonoscopy. You may have a sigmoidoscopy every 5 years or a colonoscopy every 10 years starting at age 18. Prostate cancer screening. Recommendations will vary depending on your family history and other risks. Hepatitis C blood test. Hepatitis B blood test. Sexually transmitted disease (STD) testing. Diabetes screening. This is done by checking your blood sugar (glucose) after you have not eaten for a while (fasting). You may have this done every 1-3 years. Abdominal aortic aneurysm (AAA) screening. You may need this if  you are a current or former smoker. Osteoporosis. You may be screened starting at age 49 if you are at high risk. Talk with your health care provider about your test results, treatment options, and if necessary, the need for more tests. Vaccines  Your health care provider may recommend certain vaccines, such as: Influenza vaccine. This is recommended every year. Tetanus, diphtheria, and acellular  pertussis (Tdap, Td) vaccine. You may need a Td booster every 10 years. Zoster vaccine. You may need this after age 37. Pneumococcal 13-valent conjugate (PCV13) vaccine. One dose is recommended after age 68. Pneumococcal polysaccharide (PPSV23) vaccine. One dose is recommended after age 31. Talk to your health care provider about which screenings and vaccines you need and how often you need them. This information is not intended to replace advice given to you by your health care provider. Make sure you discuss any questions you have with your health care provider. Document Released: 11/16/2015 Document Revised: 07/09/2016 Document Reviewed: 08/21/2015 Elsevier Interactive Patient Education  2017 [place] Prevention in the Home Falls can cause injuries. They can happen to people of all ages. There are many things you can do to make your home safe and to help prevent falls. What can I do on the outside of my home? Regularly fix the edges of walkways and driveways and fix any cracks. Remove anything that might make you trip as you walk through a door, such as a raised step or threshold. Trim any bushes or trees on the path to your home. Use bright outdoor lighting. Clear any walking paths of anything that might make someone trip, such as rocks or tools. Regularly check to see if handrails are loose or broken. Make sure that both sides of any steps have handrails. Any raised decks and porches should have guardrails on the edges. Have any leaves, snow, or ice cleared regularly. Use sand or salt on walking paths during winter. Clean up any spills in your garage right away. This includes oil or grease spills. What can I do in the bathroom? Use night lights. Install grab bars by the toilet and in the tub and shower. Do not use towel bars as grab bars. Use non-skid mats or decals in the tub or shower. If you need to sit down in the shower, use a plastic, non-slip stool. Keep the floor  dry. Clean up any water that spills on the floor as soon as it happens. Remove soap buildup in the tub or shower regularly. Attach bath mats securely with double-sided non-slip rug tape. Do not have throw rugs and other things on the floor that can make you trip. What can I do in the bedroom? Use night lights. Make sure that you have a light by your bed that is easy to reach. Do not use any sheets or blankets that are too big for your bed. They should not hang down onto the floor. Have a firm chair that has side arms. You can use this for support while you get dressed. Do not have throw rugs and other things on the floor that can make you trip. What can I do in the kitchen? Clean up any spills right away. Avoid walking on wet floors. Keep items that you use a lot in easy-to-reach places. If you need to reach something above you, use a strong step stool that has a grab bar. Keep electrical cords out of the way. Do not use floor polish or wax that makes floors slippery. If  you must use wax, use non-skid floor wax. Do not have throw rugs and other things on the floor that can make you trip. What can I do with my stairs? Do not leave any items on the stairs. Make sure that there are handrails on both sides of the stairs and use them. Fix handrails that are broken or loose. Make sure that handrails are as long as the stairways. Check any carpeting to make sure that it is firmly attached to the stairs. Fix any carpet that is loose or worn. Avoid having throw rugs at the top or bottom of the stairs. If you do have throw rugs, attach them to the floor with carpet tape. Make sure that you have a light switch at the top of the stairs and the bottom of the stairs. If you do not have them, ask someone to add them for you. What else can I do to help prevent falls? Wear shoes that: Do not have high heels. Have rubber bottoms. Are comfortable and fit you well. Are closed at the toe. Do not wear  sandals. If you use a stepladder: Make sure that it is fully opened. Do not climb a closed stepladder. Make sure that both sides of the stepladder are locked into place. Ask someone to hold it for you, if possible. Clearly mark and make sure that you can see: Any grab bars or handrails. First and last steps. Where the edge of each step is. Use tools that help you move around (mobility aids) if they are needed. These include: Canes. Walkers. Scooters. Crutches. Turn on the lights when you go into a dark area. Replace any light bulbs as soon as they burn out. Set up your furniture so you have a clear path. Avoid moving your furniture around. If any of your floors are uneven, fix them. If there are any pets around you, be aware of where they are. Review your medicines with your doctor. Some medicines can make you feel dizzy. This can increase your chance of falling. Ask your doctor what other things that you can do to help prevent falls. This information is not intended to replace advice given to you by your health care provider. Make sure you discuss any questions you have with your health care provider. Document Released: 08/16/2009 Document Revised: 03/27/2016 Document Reviewed: 11/24/2014 Elsevier Interactive Patient Education  2017 [employer].

## 2023-01-12 ENCOUNTER — Ambulatory Visit: Payer: PPO | Admitting: Physician Assistant

## 2023-01-12 VITALS — BP 156/94 | HR 68 | Ht 72.0 in | Wt 185.0 lb

## 2023-01-12 DIAGNOSIS — N5089 Other specified disorders of the male genital organs: Secondary | ICD-10-CM | POA: Diagnosis not present

## 2023-01-12 DIAGNOSIS — R3129 Other microscopic hematuria: Secondary | ICD-10-CM | POA: Diagnosis not present

## 2023-01-12 LAB — URINALYSIS, COMPLETE
Bilirubin, UA: NEGATIVE
Glucose, UA: NEGATIVE
Ketones, UA: NEGATIVE
Leukocytes,UA: NEGATIVE
Nitrite, UA: NEGATIVE
Protein,UA: NEGATIVE
Specific Gravity, UA: 1.015 (ref 1.005–1.030)
Urobilinogen, Ur: 0.2 mg/dL (ref 0.2–1.0)
pH, UA: 5 (ref 5.0–7.5)

## 2023-01-12 LAB — MICROSCOPIC EXAMINATION

## 2023-01-12 NOTE — Progress Notes (Signed)
01/12/2023 1:13 PM   Anthony Foley 24-Jun-1951 [id number]  CC: Chief Complaint  Patient presents with   Follow-up    Lump on testicle   HPI: Anthony Foley is a 72 y.o. male with PMH microscopic hematuria who previously deferred CT urogram and bulbar urethral stricture who presents today for evaluation of a lump on his testicle.   Today he reports he felt a lump adjacent to his right testicle about 6 months ago that resolved within about 24 hours.  It was about the size of his testicle and associated with dull pain.  He denies dysuria, gross hematuria, scrotal edema, or purulent drainage.  In-office UA today positive for 1+ blood; urine microscopy with 3-10 RBCs/HPF.  PMH: Past Medical History:  Diagnosis Date   Hyperlipidemia    Shoulder injury     Surgical History: Past Surgical History:  Procedure Laterality Date   CHOLECYSTECTOMY  1986   COLONOSCOPY  2007   divert- [facility]   COLONOSCOPY WITH PROPOFOL N/A 10/15/2018   Procedure: COLONOSCOPY WITH PROPOFOL;  Surgeon: Lucilla Lame, MD;  Location: [place] [facility];  Service: Endoscopy;  Laterality: N/A;   HERNIA REPAIR  2008   right inguinal   KNEE ARTHROSCOPY     POLYPECTOMY  10/15/2018   Procedure: POLYPECTOMY;  Surgeon: Lucilla Lame, MD;  Location: [place];  Service: Endoscopy;;   SHOULDER SURGERY     TONSILLECTOMY      Home Medications:  Allergies as of 01/12/2023   No Known Allergies      Medication List        Accurate as of January 12, 2023  1:13 PM. If you have any questions, ask your nurse or doctor.          cetirizine 10 MG tablet Commonly known as: ZYRTEC Take 1 tablet (10 mg total) by mouth daily as needed for allergies.   ibuprofen 200 MG tablet Commonly known as: ADVIL Take by mouth every 6 (six) hours as needed.   rosuvastatin 5 MG tablet Commonly known as: CRESTOR TAKE 1 TABLET BY MOUTH EVERY DAY   Vitamin D-3 25 MCG (1000 UT) Caps Take 1 capsule (1,000  Units total) by mouth daily.        Allergies:  No Known Allergies  Family History: Family History  Problem Relation Age of Onset   Yves Dill Parkinson White syndrome Mother    Heart disease Father 7       lived to 45   Uterine cancer Sister    Birth defects Paternal Grandfather    COPD Maternal Aunt     Social History:   reports that he has never smoked. He has never used smokeless tobacco. He reports that he does not currently use alcohol. He reports that he does not use drugs.  Physical Exam: BP (!) 156/94   Pulse 68   Ht 6' (1.829 m)   Wt 185 lb (83.9 kg)   BMI 25.09 kg/m   Constitutional:  Alert and oriented, no acute distress, nontoxic appearing HEENT: [place], AT Cardiovascular: No clubbing, cyanosis, or edema Respiratory: Normal respiratory effort, no increased work of breathing GU: Bilateral descended testicles. Slight fullness and tenderness of the right spermatic cord. Skin: No rashes, bruises or suspicious lesions Neurologic: Grossly intact, no focal deficits, moving all 4 extremities Psychiatric: Normal mood and affect  Laboratory Data: Results for orders placed or performed in visit on 01/12/23  Microscopic Examination   Urine  Result Value Ref Range  WBC, UA 0-5 0 - 5 /hpf   RBC, Urine 3-10 (A) 0 - 2 /hpf   Epithelial Cells (non renal) 0-10 0 - 10 /hpf   Bacteria, UA Few None seen/Few  Urinalysis, Complete  Result Value Ref Range   Specific Gravity, UA 1.015 1.005 - 1.030   pH, UA 5.0 5.0 - 7.5   Color, UA Yellow Yellow   Appearance Ur Clear Clear   Leukocytes,UA Negative Negative   Protein,UA Negative Negative/Trace   Glucose, UA Negative Negative   Ketones, UA Negative Negative   RBC, UA 1+ (A) Negative   Bilirubin, UA Negative Negative   Urobilinogen, Ur 0.2 0.2 - 1.0 mg/dL   Nitrite, UA Negative Negative   Microscopic Examination See below:    Assessment & Plan:   1. Lump in the testicle Resolved.  UA today is bland save for microscopic  hematuria, see below.  Physical exam notable for slight fullness and tenderness of the right spermatic cord.  Differential includes inguinal hernia versus hydrocele, though unusual that these would resolve and not return within 6 months.  Will obtain scrotal ultrasound for further evaluation.  Low suspicion for testicular mass. - Urinalysis, Complete - US SCROTUM W/DOPPLER; Future   2. Microscopic hematuria Persistent on UA today.  He denies episodes of gross hematuria.  I offered him repeat hematuria workup and he declined, which is reasonable.  Return for Will call with results.  Debroah Loop, PA-C  [facility] [address] [place], [place] [address] [phone]

## 2023-01-19 ENCOUNTER — Ambulatory Visit: Payer: PPO

## 2023-02-25 DIAGNOSIS — H25013 Cortical age-related cataract, bilateral: Secondary | ICD-10-CM | POA: Diagnosis not present

## 2023-04-20 ENCOUNTER — Other Ambulatory Visit: Payer: Self-pay | Admitting: Internal Medicine

## 2023-04-20 DIAGNOSIS — E782 Mixed hyperlipidemia: Secondary | ICD-10-CM

## 2023-05-08 ENCOUNTER — Ambulatory Visit (INDEPENDENT_AMBULATORY_CARE_PROVIDER_SITE_OTHER): Payer: PPO | Admitting: Internal Medicine

## 2023-05-08 VITALS — BP 124/84 | HR 72 | Ht 72.0 in | Wt 186.0 lb

## 2023-05-08 DIAGNOSIS — D485 Neoplasm of uncertain behavior of skin: Secondary | ICD-10-CM

## 2023-05-08 DIAGNOSIS — D123 Benign neoplasm of transverse colon: Secondary | ICD-10-CM | POA: Diagnosis not present

## 2023-05-08 DIAGNOSIS — R030 Elevated blood-pressure reading, without diagnosis of hypertension: Secondary | ICD-10-CM

## 2023-05-08 DIAGNOSIS — I10 Essential (primary) hypertension: Secondary | ICD-10-CM | POA: Insufficient documentation

## 2023-05-08 NOTE — Progress Notes (Signed)
Date:  05/08/2023   Name:  Anthony Foley   DOB:  Feb 24, 1951   MRN:  [id number]   Chief Complaint: Hypertension  Hypertension This is a recurrent problem. The problem has been waxing and waning (BP ranges from 155 in the AM to 130 later in the day) since onset. Pertinent negatives include no chest pain, headaches, palpitations or shortness of breath. There are no associated agents to hypertension. Past treatments include lifestyle changes.  Toe Pain  There was no injury mechanism. The pain is present in the left toes and right toes. The quality of the pain is described as aching. The pain is mild. The pain has been Fluctuating since onset. Associated symptoms comments: Hammertoes.  Rash This is a new problem. The current episode started more than 1 month ago. Progression since onset: new blue lesion on left neck. Pertinent negatives include no cough, diarrhea, fatigue or shortness of breath.    Lab Results  Component Value Date   NA 139 10/03/2022   K 4.5 10/03/2022   CO2 22 10/03/2022   GLUCOSE 91 10/03/2022   BUN 17 10/03/2022   CREATININE 1.17 10/03/2022   CALCIUM 9.9 10/03/2022   EGFR 67 10/03/2022   GFRNONAA 63 09/24/2020   Lab Results  Component Value Date   CHOL 153 10/03/2022   HDL 59 10/03/2022   LDLCALC 73 10/03/2022   TRIG 121 10/03/2022   CHOLHDL 2.6 10/03/2022   No results found for: "TSH" Lab Results  Component Value Date   HGBA1C 5.4 10/01/2021   Lab Results  Component Value Date   WBC 6.0 10/03/2022   HGB 15.4 10/03/2022   HCT 45.5 10/03/2022   MCV 91 10/03/2022   PLT 232 10/03/2022   Lab Results  Component Value Date   ALT 16 10/03/2022   AST 15 10/03/2022   ALKPHOS 96 10/03/2022   BILITOT 0.8 10/03/2022   Lab Results  Component Value Date   VD25OH 39.3 10/01/2021     Review of Systems  Constitutional:  Negative for fatigue and unexpected weight change.  HENT:  Negative for nosebleeds.   Eyes:  Negative for visual disturbance.   Respiratory:  Negative for cough, chest tightness, shortness of breath and wheezing.   Cardiovascular:  Negative for chest pain, palpitations and leg swelling.  Gastrointestinal:  Negative for abdominal pain, constipation and diarrhea.  Musculoskeletal:  Positive for arthralgias (toes of both feet).  Skin:  Positive for color change (new lesion) and rash.  Neurological:  Negative for dizziness, weakness, light-headedness and headaches.    Patient Active Problem List   Diagnosis Date Noted   Elevated blood pressure reading without diagnosis of hypertension 05/08/2023   Benign microscopic hematuria 09/24/2020   Benign neoplasm of transverse colon    Pain of left hip joint 09/21/2018   Partial tear of left rotator cuff 04/21/2017   Nontraumatic rupture of long head of biceps tendon 04/21/2017   Osteoarthritis of knee 04/21/2017   Allergic rhinitis 04/21/2017   Vitamin D deficiency, unspecified 06/25/2015   Overweight (BMI 25.0-29.9) 06/08/2015   Mixed hyperlipidemia 06/08/2015    No Known Allergies  Past Surgical History:  Procedure Laterality Date   CHOLECYSTECTOMY  1986   COLONOSCOPY  2007   divert- [place]   COLONOSCOPY WITH PROPOFOL N/A 10/15/2018   Procedure: COLONOSCOPY WITH PROPOFOL;  Surgeon: Midge Minium, MD;  Location[facility];  Service: Endoscopy;  Laterality: N/A;   HERNIA REPAIR  2008   right inguinal  KNEE ARTHROSCOPY     POLYPECTOMY  10/15/2018   Procedure: POLYPECTOMY;  Surgeon: Midge Minium, MD;  Location: [facility];  Service: Endoscopy;;   SHOULDER SURGERY     TONSILLECTOMY      Social History   Tobacco Use   Smoking status: Never   Smokeless tobacco: Never  Vaping Use   Vaping Use: Never used  Substance Use Topics   Alcohol use: Not Currently    Alcohol/week: 0.0 standard drinks of alcohol    Comment: once monthly- rare   Drug use: No     Medication list has been reviewed and updated.  Current Meds  Medication Sig    cetirizine (ZYRTEC) 10 MG tablet Take 1 tablet (10 mg total) by mouth daily as needed for allergies.   Cholecalciferol (VITAMIN D-3) 1000 units CAPS Take 1 capsule (1,000 Units total) by mouth daily.   ibuprofen (ADVIL,MOTRIN) 200 MG tablet Take by mouth every 6 (six) hours as needed.   rosuvastatin (CRESTOR) 5 MG tablet TAKE 1 TABLET BY MOUTH EVERY DAY       05/08/2023    2:11 PM 10/03/2022    9:54 AM 10/01/2021    8:06 AM 09/24/2020    7:54 AM  GAD 7 : Generalized Anxiety Score  Nervous, Anxious, on Edge 0 0 0 0  Control/stop worrying 0 0 0 0  Worry too much - different things 0 0 0 0  Trouble relaxing 0 0 0 0  Restless 0 0 0 0  Easily annoyed or irritable 0 0 0 0  Afraid - awful might happen 0 0 0 0  Total GAD 7 Score 0 0 0 0  Anxiety Difficulty Not difficult at all Not difficult at all Not difficult at all Not difficult at all       05/08/2023    2:11 PM 12/25/2022    8:16 AM 10/03/2022    9:54 AM  Depression screen PHQ 2/9  Decreased Interest 0 0 0  Down, Depressed, Hopeless 0 0 0  PHQ - 2 Score 0 0 0  Altered sleeping 0 0 0  Tired, decreased energy 0 0 0  Change in appetite 0 0 0  Feeling bad or failure about yourself  0 0 0  Trouble concentrating 0 0 0  Moving slowly or fidgety/restless 0 0 0  Suicidal thoughts 0 0 0  PHQ-9 Score 0 0 0  Difficult doing work/chores Not difficult at all Not difficult at all Not difficult at all    BP Readings from Last 3 Encounters:  05/08/23 124/84  01/12/23 (!) 156/94  10/03/22 128/78    Physical Exam Vitals and nursing note reviewed.  Constitutional:      General: He is not in acute distress.    Appearance: He is well-developed.  HENT:     Head: Normocephalic and atraumatic.  Neck:     Vascular: No carotid bruit.  Cardiovascular:     Rate and Rhythm: Normal rate and regular rhythm.  Pulmonary:     Effort: Pulmonary effort is normal. No respiratory distress.     Breath sounds: No wheezing or rhonchi.   Musculoskeletal:     Cervical back: Normal range of motion.     Right lower leg: No edema.     Left lower leg: No edema.  Lymphadenopathy:     Cervical: No cervical adenopathy.  Skin:    General: Skin is warm and dry.     Findings: Lesion (smooth blue lesion left neck) present.  No rash.     Comments: AK right Upper abdomen  Neurological:     Mental Status: He is alert and oriented to person, place, and time.  Psychiatric:        Mood and Affect: Mood normal.        Behavior: Behavior normal.     Wt Readings from Last 3 Encounters:  05/08/23 186 lb (84.4 kg)  01/12/23 185 lb (83.9 kg)  12/25/22 192 lb (87.1 kg)    BP 124/84   Pulse 72   Ht 6' (1.829 m)   Wt 186 lb (84.4 kg)   SpO2 99%   BMI 25.23 kg/m   Assessment and Plan:  Problem List Items Addressed This Visit     Elevated blood pressure reading without diagnosis of hypertension - Primary   Benign neoplasm of transverse colon   Relevant Orders   Ambulatory referral to Gastroenterology   Other Visit Diagnoses     Neoplasm of uncertain behavior of skin       Relevant Orders   Ambulatory referral to Dermatology       No follow-ups on file.   Partially dictated using Dragon software, any errors are not intentional.  [name], MD [facility] [place], [place]

## 2023-05-11 ENCOUNTER — Other Ambulatory Visit: Payer: Self-pay

## 2023-05-11 ENCOUNTER — Telehealth: Payer: Self-pay

## 2023-05-11 DIAGNOSIS — Z86010 Personal history of colonic polyps: Secondary | ICD-10-CM

## 2023-05-11 MED ORDER — NA SULFATE-K SULFATE-MG SULF 17.5-3.13-1.6 GM/177ML PO SOLN: 1.0000 | Freq: Once | ORAL | 0 refills | Status: AC

## 2023-05-11 NOTE — Telephone Encounter (Signed)
Gastroenterology Pre-Procedure Review  Request Date: 10/23/23 Requesting Physician: [name]  PATIENT REVIEW QUESTIONS: The patient responded to the following health history questions as indicated:    1. Are you having any GI issues? no 2. Do you have a personal history of Polyps? yes (Last colonoscopy performed by [name] 10/15/2018 polyp was noted) 3. Do you have a family history of Colon Cancer or Polyps? no 4. Diabetes Mellitus? no 5. Joint replacements in the past 12 months?no 6. Major health problems in the past 3 months?no 7. Any artificial heart valves, MVP, or defibrillator?no    MEDICATIONS & ALLERGIES:    Patient reports the following regarding taking any anticoagulation/antiplatelet therapy:   Plavix, Coumadin, Eliquis, Xarelto, Lovenox, Pradaxa, Brilinta, or Effient? no Aspirin? no  Patient confirms/reports the following medications:  Current Outpatient Medications  Medication Sig Dispense Refill   cetirizine (ZYRTEC) 10 MG tablet Take 1 tablet (10 mg total) by mouth daily as needed for allergies. 30 tablet 11   Cholecalciferol (VITAMIN D-3) 1000 units CAPS Take 1 capsule (1,000 Units total) by mouth daily. 60 capsule    ibuprofen (ADVIL,MOTRIN) 200 MG tablet Take by mouth every 6 (six) hours as needed.     rosuvastatin (CRESTOR) 5 MG tablet TAKE 1 TABLET BY MOUTH EVERY DAY 90 tablet 0   No current facility-administered medications for this visit.    Patient confirms/reports the following allergies:  No Known Allergies  No orders of the defined types were placed in this encounter.   AUTHORIZATION INFORMATION Primary Insurance: 1D#: Group #:  Secondary Insurance: 1D#: Group #:  SCHEDULE INFORMATION: Date: 10/23/23 Time: Location: [facility]

## 2023-07-26 ENCOUNTER — Other Ambulatory Visit: Payer: Self-pay | Admitting: Internal Medicine

## 2023-07-26 DIAGNOSIS — E782 Mixed hyperlipidemia: Secondary | ICD-10-CM

## 2023-10-07 DIAGNOSIS — L821 Other seborrheic keratosis: Secondary | ICD-10-CM | POA: Diagnosis not present

## 2023-10-07 DIAGNOSIS — D492 Neoplasm of unspecified behavior of bone, soft tissue, and skin: Secondary | ICD-10-CM | POA: Diagnosis not present

## 2023-10-07 DIAGNOSIS — L918 Other hypertrophic disorders of the skin: Secondary | ICD-10-CM | POA: Diagnosis not present

## 2023-10-08 ENCOUNTER — Ambulatory Visit (INDEPENDENT_AMBULATORY_CARE_PROVIDER_SITE_OTHER): Payer: PPO | Admitting: Internal Medicine

## 2023-10-08 ENCOUNTER — Encounter: Payer: Self-pay | Admitting: Internal Medicine

## 2023-10-08 VITALS — BP 128/84 | HR 72 | Ht 72.0 in | Wt 192.0 lb

## 2023-10-08 DIAGNOSIS — E559 Vitamin D deficiency, unspecified: Secondary | ICD-10-CM | POA: Diagnosis not present

## 2023-10-08 DIAGNOSIS — R311 Benign essential microscopic hematuria: Secondary | ICD-10-CM

## 2023-10-08 DIAGNOSIS — E782 Mixed hyperlipidemia: Secondary | ICD-10-CM | POA: Diagnosis not present

## 2023-10-08 DIAGNOSIS — R030 Elevated blood-pressure reading, without diagnosis of hypertension: Secondary | ICD-10-CM

## 2023-10-08 DIAGNOSIS — Z1211 Encounter for screening for malignant neoplasm of colon: Secondary | ICD-10-CM

## 2023-10-08 DIAGNOSIS — Z125 Encounter for screening for malignant neoplasm of prostate: Secondary | ICD-10-CM | POA: Diagnosis not present

## 2023-10-08 DIAGNOSIS — Z0000 Encounter for general adult medical examination without abnormal findings: Secondary | ICD-10-CM

## 2023-10-08 MED ORDER — LOSARTAN POTASSIUM 25 MG PO TABS: 25.0000 mg | ORAL_TABLET | Freq: Every day | ORAL | 0 refills | Status: DC

## 2023-10-08 NOTE — Assessment & Plan Note (Signed)
Readings continue to be elevated without symptoms Will begin losartan 25 mg daily - follow up in 2 months.

## 2023-10-08 NOTE — Assessment & Plan Note (Signed)
LDL is  Lab Results  Component Value Date   LDLCALC 73 10/03/2022    Current regimen is Crestor 5 mg.  Tolerating medications well without issues.

## 2023-10-08 NOTE — Progress Notes (Signed)
Date:  10/08/2023   Name:  Anthony Foley   DOB:  1951-01-01   MRN:  [id number]   Chief Complaint: Annual Exam Anthony Foley is a 72 y.o. male who presents today for his Complete Annual Exam. He feels well. He reports exercising. He reports he is sleeping well.   Colonoscopy: 10/2018 repeat 5 yrs - scheduled  Immunization History  Administered Date(s) Administered   Fluad Quad(high Dose 65+) 10/03/2022   Influenza, High Dose Seasonal PF 10/21/2017, 09/21/2018, 08/26/2019   Influenza,inj,Quad PF,6+ Mos 09/03/2015, 09/04/2016   Influenza-Unspecified 08/14/2020, 08/09/2021, 08/27/2023   PFIZER(Purple Top)SARS-COV-2 Vaccination 12/13/2019, 01/03/2020, 08/14/2020   Pfizer Covid-19 Vaccine Bivalent Booster 72yrs & up 08/09/2021   Pneumococcal Conjugate-13 10/21/2017   Pneumococcal Polysaccharide-23 11/22/2018   Tdap 10/15/2016   Zoster Recombinant(Shingrix) 08/26/2019, 10/31/2019   Health Maintenance Due  Topic Date Due   Colonoscopy  10/16/2023    Lab Results  Component Value Date   PSA1 1.2 10/03/2022   PSA1 1.2 10/01/2021   PSA1 1.1 09/24/2020    Hyperlipidemia This is a chronic problem. The problem is controlled. Pertinent negatives include no chest pain or shortness of breath. Current antihyperlipidemic treatment includes statins. The current treatment provides significant improvement of lipids.  Hypertension This is a new problem. The problem has been waxing and waning since onset. The problem is uncontrolled (most readings at home 138/88-90). Pertinent negatives include no anxiety, chest pain, headaches, malaise/fatigue, orthopnea, palpitations, peripheral edema or shortness of breath. There are no associated agents to hypertension. The current treatment provides no improvement. There are no compliance problems.     Review of Systems  Constitutional:  Negative for chills, fever, malaise/fatigue and unexpected weight change.  HENT:  Negative for trouble  swallowing.   Respiratory:  Negative for chest tightness and shortness of breath.   Cardiovascular:  Negative for chest pain, palpitations and orthopnea.  Gastrointestinal:  Negative for abdominal pain, constipation and nausea.  Musculoskeletal:  Negative for arthralgias and gait problem (bilateral hammer toes).  Neurological:  Negative for headaches.  Psychiatric/Behavioral:  Negative for dysphoric mood and sleep disturbance. The patient is not nervous/anxious.      Lab Results  Component Value Date   NA 139 10/03/2022   K 4.5 10/03/2022   CO2 22 10/03/2022   GLUCOSE 91 10/03/2022   BUN 17 10/03/2022   CREATININE 1.17 10/03/2022   CALCIUM 9.9 10/03/2022   EGFR 67 10/03/2022   GFRNONAA 63 09/24/2020   Lab Results  Component Value Date   CHOL 153 10/03/2022   HDL 59 10/03/2022   LDLCALC 73 10/03/2022   TRIG 121 10/03/2022   CHOLHDL 2.6 10/03/2022   No results found for: "TSH" Lab Results  Component Value Date   HGBA1C 5.4 10/01/2021   Lab Results  Component Value Date   WBC 6.0 10/03/2022   HGB 15.4 10/03/2022   HCT 45.5 10/03/2022   MCV 91 10/03/2022   PLT 232 10/03/2022   Lab Results  Component Value Date   ALT 16 10/03/2022   AST 15 10/03/2022   ALKPHOS 96 10/03/2022   BILITOT 0.8 10/03/2022   Lab Results  Component Value Date   VD25OH 39.3 10/01/2021     Patient Active Problem List   Diagnosis Date Noted   Elevated blood pressure reading without diagnosis of hypertension 05/08/2023   Benign microscopic hematuria 09/24/2020   Benign neoplasm of transverse colon    Pain of left hip joint 09/21/2018   Partial tear of  left rotator cuff 04/21/2017   Nontraumatic rupture of long head of biceps tendon 04/21/2017   Osteoarthritis of knee 04/21/2017   Allergic rhinitis 04/21/2017   Vitamin D deficiency, unspecified 06/25/2015   Overweight (BMI 25.0-29.9) 06/08/2015   Mixed hyperlipidemia 06/08/2015    No Known Allergies  Past Surgical History:   Procedure Laterality Date   CHOLECYSTECTOMY  1986   COLONOSCOPY  2007   divert- [place] [facility]   COLONOSCOPY WITH PROPOFOL N/A 10/15/2018   Procedure: COLONOSCOPY WITH PROPOFOL;  Surgeon: Midge Minium, MD;  Location: [facility];  Service: Endoscopy;  Laterality: N/A;   HERNIA REPAIR  2008   right inguinal   KNEE ARTHROSCOPY     POLYPECTOMY  10/15/2018   Procedure: POLYPECTOMY;  Surgeon: Midge Minium, MD;  Location: [facility];  Service: Endoscopy;;   SHOULDER SURGERY     TONSILLECTOMY      Social History   Tobacco Use   Smoking status: Never   Smokeless tobacco: Never  Vaping Use   Vaping status: Never Used  Substance Use Topics   Alcohol use: Not Currently    Alcohol/week: 0.0 standard drinks of alcohol    Comment: once monthly- rare   Drug use: No     Medication list has been reviewed and updated.  Current Meds  Medication Sig   losartan (COZAAR) 25 MG tablet Take 1 tablet (25 mg total) by mouth daily.       05/08/2023    2:11 PM 10/03/2022    9:54 AM 10/01/2021    8:06 AM 09/24/2020    7:54 AM  GAD 7 : Generalized Anxiety Score  Nervous, Anxious, on Edge 0 0 0 0  Control/stop worrying 0 0 0 0  Worry too much - different things 0 0 0 0  Trouble relaxing 0 0 0 0  Restless 0 0 0 0  Easily annoyed or irritable 0 0 0 0  Afraid - awful might happen 0 0 0 0  Total GAD 7 Score 0 0 0 0  Anxiety Difficulty Not difficult at all Not difficult at all Not difficult at all Not difficult at all       05/08/2023    2:11 PM 12/25/2022    8:16 AM 10/03/2022    9:54 AM  Depression screen PHQ 2/9  Decreased Interest 0 0 0  Down, Depressed, Hopeless 0 0 0  PHQ - 2 Score 0 0 0  Altered sleeping 0 0 0  Tired, decreased energy 0 0 0  Change in appetite 0 0 0  Feeling bad or failure about yourself  0 0 0  Trouble concentrating 0 0 0  Moving slowly or fidgety/restless 0 0 0  Suicidal thoughts 0 0 0  PHQ-9 Score 0 0 0  Difficult doing work/chores Not difficult  at all Not difficult at all Not difficult at all    BP Readings from Last 3 Encounters:  10/08/23 128/84  05/08/23 124/84  01/12/23 (!) 156/94    Physical Exam Vitals and nursing note reviewed.  Constitutional:      Appearance: Normal appearance. He is well-developed.  HENT:     Head: Normocephalic.     Right Ear: Tympanic membrane, ear canal and external ear normal.     Left Ear: Tympanic membrane, ear canal and external ear normal.     Nose: Nose normal.  Eyes:     Conjunctiva/sclera: Conjunctivae normal.     Pupils: Pupils are equal, round, and reactive to light.  Neck:     Thyroid: No thyromegaly.     Vascular: No carotid bruit.  Cardiovascular:     Rate and Rhythm: Normal rate and regular rhythm.     Heart sounds: Normal heart sounds.  Pulmonary:     Effort: Pulmonary effort is normal.     Breath sounds: Normal breath sounds. No wheezing.  Chest:  Breasts:    Right: No mass.     Left: No mass.  Abdominal:     General: Bowel sounds are normal.     Palpations: Abdomen is soft.     Tenderness: There is no abdominal tenderness.  Musculoskeletal:        General: Normal range of motion.     Cervical back: Normal range of motion and neck supple.     Right lower leg: No edema.     Left lower leg: No edema.  Lymphadenopathy:     Cervical: No cervical adenopathy.  Skin:    General: Skin is warm and dry.     Capillary Refill: Capillary refill takes less than 2 seconds.  Neurological:     General: No focal deficit present.     Mental Status: He is alert and oriented to person, place, and time.     Deep Tendon Reflexes: Reflexes are normal and symmetric.  Psychiatric:        Attention and Perception: Attention normal.        Mood and Affect: Mood normal.        Thought Content: Thought content normal.     Wt Readings from Last 3 Encounters:  10/08/23 192 lb (87.1 kg)  05/08/23 186 lb (84.4 kg)  01/12/23 185 lb (83.9 kg)    BP 128/84   Pulse 72   Ht 6' (1.829  m)   Wt 192 lb (87.1 kg)   SpO2 94%   BMI 26.04 kg/m   Assessment and Plan:  Problem List Items Addressed This Visit       Unprioritized   Mixed hyperlipidemia    LDL is  Lab Results  Component Value Date   LDLCALC 73 10/03/2022    Current regimen is Crestor 5 mg.  Tolerating medications well without issues.       Relevant Medications   losartan (COZAAR) 25 MG tablet   Other Relevant Orders   Lipid panel   Vitamin D deficiency, unspecified    On daily supplement; no recent level has been checked      Relevant Orders   VITAMIN D 25 Hydroxy (Vit-D Deficiency, Fractures)   Benign microscopic hematuria   Relevant Orders   Urinalysis, Routine w reflex microscopic   Elevated blood pressure reading without diagnosis of hypertension    Readings continue to be elevated without symptoms Will begin losartan 25 mg daily - follow up in 2 months.      Relevant Medications   losartan (COZAAR) 25 MG tablet   Other Relevant Orders   CBC with Differential/Platelet   Comprehensive metabolic panel   Urinalysis, Routine w reflex microscopic   Other Visit Diagnoses     Annual physical exam    -  Primary   continue healthy diet, limited sodium regular daily exercise   Colon cancer screening       colonoscopy scheduled   Prostate cancer screening       Relevant Orders   PSA       Return in about 2 months (around 12/09/2023) for HTN.    [name], MD [facility] [place]

## 2023-10-08 NOTE — Assessment & Plan Note (Signed)
On daily supplement; no recent level has been checked

## 2023-10-09 LAB — URINALYSIS, ROUTINE W REFLEX MICROSCOPIC
Bilirubin, UA: NEGATIVE
Glucose, UA: NEGATIVE
Leukocytes,UA: NEGATIVE
Nitrite, UA: NEGATIVE
Protein,UA: NEGATIVE
RBC, UA: NEGATIVE
Specific Gravity, UA: 1.026 (ref 1.005–1.030)
Urobilinogen, Ur: 0.2 mg/dL (ref 0.2–1.0)
pH, UA: 5.5 (ref 5.0–7.5)

## 2023-10-09 LAB — COMPREHENSIVE METABOLIC PANEL WITH GFR
ALT: 19 [IU]/L (ref 0–44)
AST: 20 [IU]/L (ref 0–40)
Albumin: 4.7 g/dL (ref 3.8–4.8)
Alkaline Phosphatase: 101 [IU]/L (ref 44–121)
BUN/Creatinine Ratio: 21 (ref 10–24)
BUN: 24 mg/dL (ref 8–27)
Bilirubin Total: 0.8 mg/dL (ref 0.0–1.2)
CO2: 25 mmol/L (ref 20–29)
Calcium: 9.4 mg/dL (ref 8.6–10.2)
Chloride: 103 mmol/L (ref 96–106)
Creatinine, Ser: 1.13 mg/dL (ref 0.76–1.27)
Globulin, Total: 2.4 g/dL (ref 1.5–4.5)
Glucose: 85 mg/dL (ref 70–99)
Potassium: 4.4 mmol/L (ref 3.5–5.2)
Sodium: 141 mmol/L (ref 134–144)
Total Protein: 7.1 g/dL (ref 6.0–8.5)
eGFR: 69 mL/min/{1.73_m2}

## 2023-10-09 LAB — CBC WITH DIFFERENTIAL/PLATELET
Basophils Absolute: 0 10*3/uL (ref 0.0–0.2)
Basos: 1 %
EOS (ABSOLUTE): 0.1 10*3/uL (ref 0.0–0.4)
Eos: 2 %
Hematocrit: 47.4 % (ref 37.5–51.0)
Hemoglobin: 15.5 g/dL (ref 13.0–17.7)
Immature Grans (Abs): 0 10*3/uL (ref 0.0–0.1)
Immature Granulocytes: 0 %
Lymphocytes Absolute: 1.9 10*3/uL (ref 0.7–3.1)
Lymphs: 32 %
MCH: 30.5 pg (ref 26.6–33.0)
MCHC: 32.7 g/dL (ref 31.5–35.7)
MCV: 93 fL (ref 79–97)
Monocytes Absolute: 0.4 10*3/uL (ref 0.1–0.9)
Monocytes: 7 %
Neutrophils Absolute: 3.4 10*3/uL (ref 1.4–7.0)
Neutrophils: 58 %
Platelets: 266 10*3/uL (ref 150–450)
RBC: 5.09 x10E6/uL (ref 4.14–5.80)
RDW: 14.1 % (ref 11.6–15.4)
WBC: 5.8 10*3/uL (ref 3.4–10.8)

## 2023-10-09 LAB — LIPID PANEL
Chol/HDL Ratio: 3 {ratio} (ref 0.0–5.0)
Cholesterol, Total: 169 mg/dL (ref 100–199)
HDL: 56 mg/dL (ref >39)
LDL Chol Calc (NIH): 96 mg/dL (ref 0–99)
Triglycerides: 92 mg/dL (ref 0–149)
VLDL Cholesterol Cal: 17 mg/dL (ref 5–40)

## 2023-10-09 LAB — VITAMIN D 25 HYDROXY (VIT D DEFICIENCY, FRACTURES): Vit D, 25-Hydroxy: 34 ng/mL (ref 30.0–100.0)

## 2023-10-09 LAB — PSA: Prostate Specific Ag, Serum: 1.1 ng/mL (ref 0.0–4.0)

## 2023-10-14 ENCOUNTER — Encounter: Payer: Self-pay | Admitting: Gastroenterology

## 2023-10-15 NOTE — Anesthesia Preprocedure Evaluation (Addendum)
Anesthesia Evaluation  Patient identified by MRN, date of birth, ID band Patient awake    Reviewed: Allergy & Precautions, H&P , NPO status , Patient's Chart, lab work & pertinent test results  Airway Mallampati: III  TM Distance: >3 FB Neck ROM: Full    Dental no notable dental hx.  Has some crowns, not sure which teeth, all intact:   Pulmonary neg pulmonary ROS   Pulmonary exam normal breath sounds clear to auscultation       Cardiovascular negative cardio ROS Normal cardiovascular exam Rhythm:Regular Rate:Normal     Neuro/Psych negative neurological ROS  negative psych ROS   GI/Hepatic negative GI ROS, Neg liver ROS,,,  Endo/Other  negative endocrine ROS    Renal/GU negative Renal ROS  negative genitourinary   Musculoskeletal negative musculoskeletal ROS (+)    Abdominal   Peds negative pediatric ROS (+)  Hematology negative hematology ROS (+)   Anesthesia Other Findings "Borderline" HTN hyperlipidemia  Reproductive/Obstetrics negative OB ROS                             Anesthesia Physical Anesthesia Plan  ASA: 2  Anesthesia Plan: General   Post-op Pain Management:    Induction: Intravenous  PONV Risk Score and Plan:   Airway Management Planned: Natural Airway and Nasal Cannula  Additional Equipment:   Intra-op Plan:   Post-operative Plan:   Informed Consent: I have reviewed the patients History and Physical, chart, labs and discussed the procedure including the risks, benefits and alternatives for the proposed anesthesia with the patient or authorized representative who has indicated his/her understanding and acceptance.     Dental Advisory Given  Plan Discussed with: Anesthesiologist, CRNA and Surgeon  Anesthesia Plan Comments: (Patient consented for risks of anesthesia including but not limited to:  - adverse reactions to medications - risk of airway placement  if required - damage to eyes, teeth, lips or other oral mucosa - nerve damage due to positioning  - sore throat or hoarseness - Damage to heart, brain, nerves, lungs, other parts of body or loss of life  Patient voiced understanding and assent.)       Anesthesia Quick Evaluation

## 2023-10-23 ENCOUNTER — Ambulatory Visit
Admission: RE | Admit: 2023-10-23 | Discharge: 2023-10-23 | Disposition: A | Discharge status: Home / Self Care | Payer: PPO | Attending: Gastroenterology | Admitting: Gastroenterology

## 2023-10-23 ENCOUNTER — Encounter
Admission: RE | Disposition: A | Discharge status: Home / Self Care | Payer: Self-pay | Source: Home / Self Care | Attending: Gastroenterology

## 2023-10-23 ENCOUNTER — Other Ambulatory Visit: Payer: Self-pay

## 2023-10-23 ENCOUNTER — Ambulatory Visit: Payer: PPO | Admitting: Anesthesiology

## 2023-10-23 ENCOUNTER — Encounter: Payer: Self-pay | Admitting: Gastroenterology

## 2023-10-23 DIAGNOSIS — Z1211 Encounter for screening for malignant neoplasm of colon: Secondary | ICD-10-CM | POA: Diagnosis present

## 2023-10-23 DIAGNOSIS — Z860101 Personal history of adenomatous and serrated colon polyps: Secondary | ICD-10-CM | POA: Diagnosis not present

## 2023-10-23 DIAGNOSIS — Z09 Encounter for follow-up examination after completed treatment for conditions other than malignant neoplasm: Secondary | ICD-10-CM | POA: Insufficient documentation

## 2023-10-23 DIAGNOSIS — K635 Polyp of colon: Secondary | ICD-10-CM | POA: Diagnosis not present

## 2023-10-23 DIAGNOSIS — D122 Benign neoplasm of ascending colon: Secondary | ICD-10-CM | POA: Diagnosis not present

## 2023-10-23 DIAGNOSIS — K5730 Diverticulosis of large intestine without perforation or abscess without bleeding: Secondary | ICD-10-CM | POA: Insufficient documentation

## 2023-10-23 DIAGNOSIS — Z860100 Personal history of colon polyps, unspecified: Secondary | ICD-10-CM

## 2023-10-23 HISTORY — PX: COLONOSCOPY WITH PROPOFOL: SHX5780

## 2023-10-23 HISTORY — DX: Elevated blood-pressure reading, without diagnosis of hypertension: R03.0

## 2023-10-23 SURGERY — COLONOSCOPY WITH PROPOFOL
Anesthesia: General

## 2023-10-23 MED ORDER — PROPOFOL 10 MG/ML IV BOLUS
INTRAVENOUS | Status: DC | PRN
  Administered 2023-10-23: 50 mg via INTRAVENOUS
  Administered 2023-10-23: 40 mg via INTRAVENOUS
  Administered 2023-10-23: 50 mg via INTRAVENOUS
  Administered 2023-10-23: 90 mg via INTRAVENOUS
  Administered 2023-10-23: 20 mg via INTRAVENOUS

## 2023-10-23 MED ORDER — LIDOCAINE HCL (PF) 2 % IJ SOLN
INTRAMUSCULAR | Status: AC
  Filled 2023-10-23: qty 5

## 2023-10-23 MED ORDER — LACTATED RINGERS IV SOLN: INTRAVENOUS | Status: DC

## 2023-10-23 MED ORDER — LIDOCAINE HCL (CARDIAC) PF 100 MG/5ML IV SOSY
PREFILLED_SYRINGE | INTRAVENOUS | Status: DC | PRN
  Administered 2023-10-23: 50 mg via INTRAVENOUS

## 2023-10-23 MED ORDER — PHENYLEPHRINE HCL (PRESSORS) 10 MG/ML IV SOLN
INTRAVENOUS | Status: DC | PRN
  Administered 2023-10-23: 80 ug via INTRAVENOUS

## 2023-10-23 MED ORDER — PHENYLEPHRINE 80 MCG/ML (10ML) SYRINGE FOR IV PUSH (FOR BLOOD PRESSURE SUPPORT)
PREFILLED_SYRINGE | INTRAVENOUS | Status: AC
  Filled 2023-10-23: qty 10

## 2023-10-23 MED ORDER — PROPOFOL 10 MG/ML IV BOLUS
INTRAVENOUS | Status: AC
  Filled 2023-10-23: qty 40

## 2023-10-23 MED ORDER — STERILE WATER FOR IRRIGATION IR SOLN: Status: DC | PRN

## 2023-10-23 MED ORDER — SODIUM CHLORIDE 0.9 % IV SOLN: INTRAVENOUS | Status: DC

## 2023-10-23 SURGICAL SUPPLY — 6 items
GOWN CVR UNV OPN BCK APRN NK (MISCELLANEOUS) ×2 IMPLANT
KIT PRC NS LF DISP ENDO (KITS) ×1 IMPLANT
MANIFOLD NEPTUNE II (INSTRUMENTS) ×1 IMPLANT
SNARE COLD EXACTO (MISCELLANEOUS) IMPLANT
TRAP ETRAP POLY (MISCELLANEOUS) IMPLANT
WATER STERILE IRR 250ML POUR (IV SOLUTION) ×1 IMPLANT

## 2023-10-23 NOTE — H&P (Signed)
Anthony Minium, MD [facility] [address] [place], [place] [address] [phone] Fax : [phone]  Primary Care Physician:  [name], MD Primary Gastroenterologist:  [name]  Pre-Procedure History & Physical: HPI:  Anthony Foley is a 73 y.o. male is here for an colonoscopy.   Past Medical History:  Diagnosis Date   Borderline hypertension    Hyperlipidemia    Shoulder injury     Past Surgical History:  Procedure Laterality Date   CHOLECYSTECTOMY  1986   COLONOSCOPY  2007   divert- [place] [facility]   COLONOSCOPY WITH PROPOFOL N/A 10/15/2018   Procedure: COLONOSCOPY WITH PROPOFOL;  Surgeon: Anthony Minium, MD;  Location: [facility];  Service: Endoscopy;  Laterality: N/A;   HERNIA REPAIR  2008   right inguinal   KNEE ARTHROSCOPY     POLYPECTOMY  10/15/2018   Procedure: POLYPECTOMY;  Surgeon: Anthony Minium, MD;  Location: [facility];  Service: Endoscopy;;   SHOULDER SURGERY     TONSILLECTOMY      Prior to Admission medications   Medication Sig Start Date End Date Taking? Authorizing Provider  cetirizine (ZYRTEC) 10 MG tablet Take 1 tablet (10 mg total) by mouth daily as needed for allergies. 10/21/17  Yes [name], [name], MD  Cholecalciferol (VITAMIN D-3) 1000 units CAPS Take 1 capsule (1,000 Units total) by mouth daily. 10/22/17  Yes [name], [name], MD  ibuprofen (ADVIL,MOTRIN) 200 MG tablet Take by mouth every 6 (six) hours as needed.   Yes [provider]  losartan (COZAAR) 25 MG tablet Take 1 tablet (25 mg total) by mouth daily. 10/08/23  Yes [name], MD  rosuvastatin (CRESTOR) 5 MG tablet TAKE 1 TABLET BY MOUTH EVERY DAY 07/26/23  Yes [name], MD    Allergies as of 05/11/2023   (No Known Allergies)    Family History  Problem Relation Age of Onset   [name] Parkinson White syndrome Mother    Heart disease Father 28       lived to 27   Uterine cancer Sister    Birth defects Paternal Grandfather    COPD  Maternal Aunt     Social History   Socioeconomic History   Marital status: Married    Spouse name: Not on file   Number of children: Not on file   Years of education: Not on file   Highest education level: 12th grade  Occupational History   Not on file  Tobacco Use   Smoking status: Never   Smokeless tobacco: Never  Vaping Use   Vaping status: Never Used  Substance and Sexual Activity   Alcohol use: Not Currently    Alcohol/week: 0.0 standard drinks of alcohol    Comment: once monthly- rare   Drug use: No   Sexual activity: Yes  Other Topics Concern   Not on file  Social History Narrative   Not on file   Social Drivers of Health   Financial Resource Strain: Low Risk  (05/08/2023)   Overall Financial Resource Strain (CARDIA)    Difficulty of Paying Living Expenses: Not hard at all  Food Insecurity: No Food Insecurity (05/08/2023)   Hunger Vital Sign    Worried About Running Out of Food in the Last Year: Never true    Ran Out of Food in the Last Year: Never true  Transportation Needs: No Transportation Needs (05/08/2023)   PRAPARE - Administrator, Civil Service (Medical): No    Lack of Transportation (  Non-Medical): No  Physical Activity: Sufficiently Active (05/08/2023)   Exercise Vital Sign    Days of Exercise per Week: 6 days    Minutes of Exercise per Session: 30 min  Stress: No Stress Concern Present (05/08/2023)   [employer] of Occupational Health - Occupational Stress Questionnaire    Feeling of Stress : Not at all  Social Connections: Socially Integrated (05/08/2023)   Social Connection and Isolation Panel [NHANES]    Frequency of Communication with Friends and Family: Twice a week    Frequency of Social Gatherings with Friends and Family: Twice a week    Attends Religious Services: More than 4 times per year    Active Member of [employer] or Organizations: Yes    Attends Banker Meetings: 1 to 4 times per year    Marital Status: Married   Catering manager Violence: Not At Risk (12/25/2022)   Humiliation, Afraid, Rape, and Kick questionnaire    Fear of Current or Ex-Partner: No    Emotionally Abused: No    Physically Abused: No    Sexually Abused: No    Review of Systems: See HPI, otherwise negative ROS  Physical Exam: BP (!) 139/103   Pulse 70   Temp 98.2 F (36.8 C)   Ht 6' (1.829 m)   Wt 83 kg   SpO2 100%   BMI 24.82 kg/m  General:   Alert,  pleasant and cooperative in NAD Head:  Normocephalic and atraumatic. Neck:  Supple; no masses or thyromegaly. Lungs:  Clear throughout to auscultation.    Heart:  Regular rate and rhythm. Abdomen:  Soft, nontender and nondistended. Normal bowel sounds, without guarding, and without rebound.   Neurologic:  Alert and  oriented x4;  grossly normal neurologically.  Impression/Plan: Anthony Foley is here for an colonoscopy to be performed for a history of adenomatous polyps on 2019   Risks, benefits, limitations, and alternatives regarding  colonoscopy have been reviewed with the patient.  Questions have been answered.  All parties agreeable.   Anthony Minium, MD  10/23/2023, 7:33 AM

## 2023-10-23 NOTE — Transfer of Care (Signed)
Immediate Anesthesia Transfer of Care Note  Patient: Anthony Foley  Procedure(s) Performed: COLONOSCOPY WITH PROPOFOL WITH POLYPECTOMY  Patient Location: PACU  Anesthesia Type: General  Level of Consciousness: awake, alert  and patient cooperative  Airway and Oxygen Therapy: Patient Spontanous Breathing and Patient connected to supplemental oxygen  Post-op Assessment: Post-op Vital signs reviewed, Patient's Cardiovascular Status Stable, Respiratory Function Stable, Patent Airway and No signs of Nausea or vomiting  Post-op Vital Signs: Reviewed and stable  Complications: No notable events documented.

## 2023-10-23 NOTE — Anesthesia Postprocedure Evaluation (Signed)
Anesthesia Post Note  Patient: Anthony Foley  Procedure(s) Performed: COLONOSCOPY WITH PROPOFOL WITH POLYPECTOMY  Patient location during evaluation: PACU Anesthesia Type: General Level of consciousness: awake and alert Pain management: pain level controlled Vital Signs Assessment: post-procedure vital signs reviewed and stable Respiratory status: spontaneous breathing, nonlabored ventilation, respiratory function stable and patient connected to nasal cannula oxygen Cardiovascular status: blood pressure returned to baseline and stable Postop Assessment: no apparent nausea or vomiting Anesthetic complications: no   No notable events documented.   Last Vitals:  Vitals:   10/23/23 0828 10/23/23 0830  BP: 97/69 100/76  Pulse: 68 69  Resp: 16 17  Temp:  36.9 C  SpO2: 98% 97%    Last Pain:  Vitals:   10/23/23 0830  PainSc: 0-No pain                 Clydine Parkison C Rivky Clendenning

## 2023-10-23 NOTE — Op Note (Signed)
[facility] Patient Name: Anthony Foley Procedure Date: 10/23/2023 6:20 AM MRN: [id number] Account #: [id number] Date of Birth: [birth year] Admit Type: Outpatient Age: 72 Room: [facility] OR ROOM 01 Gender: Male Note Status: Finalized Instrument Name: [id number] Procedure:             Colonoscopy Indications:           High risk colon cancer surveillance: Personal history                         of colonic polyps Providers:             Midge Minium MD, MD Referring MD:          Bari Edward, MD (Referring MD) Medicines:             Propofol per Anesthesia Complications:         No immediate complications. Procedure:             Pre-Anesthesia Assessment:                        - Prior to the procedure, a History and Physical was                         performed, and patient medications and allergies were                         reviewed. The patient's tolerance of previous                         anesthesia was also reviewed. The risks and benefits                         of the procedure and the sedation options and risks                         were discussed with the patient. All questions were                         answered, and informed consent was obtained. Prior                         Anticoagulants: The patient has taken no anticoagulant                         or antiplatelet agents. ASA Grade Assessment: II - A                         patient with mild systemic disease. After reviewing                         the risks and benefits, the patient was deemed in                         satisfactory condition to undergo the procedure.                        After obtaining informed consent, the colonoscope was  passed under direct vision. Throughout the procedure,                         the patient's blood pressure, pulse, and oxygen                         saturations were monitored continuously. The                          Colonoscope was introduced through the anus and                         advanced to the the cecum, identified by appendiceal                         orifice and ileocecal valve. The colonoscopy was                         performed without difficulty. The patient tolerated                         the procedure well. The quality of the bowel                         preparation was excellent. Findings:      The perianal and digital rectal examinations were normal.      A 3 mm polyp was found in the ascending colon. The polyp was sessile.       The polyp was removed with a cold snare. Resection and retrieval were       complete.      A few small-mouthed diverticula were found in the sigmoid colon. Impression:            - One 3 mm polyp in the ascending colon, removed with                         a cold snare. Resected and retrieved.                        - Diverticulosis in the sigmoid colon. Recommendation:        - Discharge patient to home.                        - Resume previous diet.                        - Continue present medications.                        - Await pathology results.                        - Repeat colonoscopy in 7 years for surveillance. Procedure Code(s):     --- Professional ---                        [phone], Colonoscopy, flexible; with removal of                         tumor(s), polyp(s), or other lesion(s) by snare  technique Diagnosis Code(s):     --- Professional ---                        Z86.010, Personal history of colonic polyps                        D12.2, Benign neoplasm of ascending colon CPT copyright 2022 American Medical Association. All rights reserved. The codes documented in this report are preliminary and upon coder review may  be revised to meet current compliance requirements. Midge Minium MD, MD 10/23/2023 8:11:29 AM This report has been signed electronically. Number of Addenda: 0 Note Initiated On:  10/23/2023 6:20 AM Scope Withdrawal Time: 0 hours 9 minutes 29 seconds  Total Procedure Duration: 0 hours 12 minutes 6 seconds  Estimated Blood Loss:  Estimated blood loss: none. Estimated blood loss: none.      [facility]

## 2023-10-24 ENCOUNTER — Encounter: Payer: Self-pay | Admitting: Gastroenterology

## 2023-10-27 ENCOUNTER — Encounter: Payer: Self-pay | Admitting: Gastroenterology

## 2023-10-27 LAB — SURGICAL PATHOLOGY

## 2023-12-14 ENCOUNTER — Encounter: Payer: Self-pay | Admitting: Internal Medicine

## 2023-12-14 ENCOUNTER — Ambulatory Visit (INDEPENDENT_AMBULATORY_CARE_PROVIDER_SITE_OTHER): Payer: PPO | Admitting: Internal Medicine

## 2023-12-14 DIAGNOSIS — I10 Essential (primary) hypertension: Secondary | ICD-10-CM | POA: Diagnosis not present

## 2023-12-14 MED ORDER — LOSARTAN POTASSIUM 50 MG PO TABS
50.0000 mg | ORAL_TABLET | Freq: Every day | ORAL | 1 refills | Status: DC
Start: 2023-12-14 — End: 2024-06-10

## 2023-12-14 NOTE — Assessment & Plan Note (Signed)
 improved BP with normal exam. Current regimen is losartan  25 mg. Continue exercise and healthy diet Increase to losartan  50 mg daily

## 2023-12-14 NOTE — Progress Notes (Signed)
 Date:  12/14/2023   Name:  Anthony Foley   DOB:  [birth year]   MRN:  [id number]   Chief Complaint: Hypertension  Hypertension This is a chronic problem. The problem is uncontrolled. Pertinent negatives include no chest pain, headaches, palpitations or shortness of breath. Past treatments include angiotensin blockers. The current treatment provides mild improvement.    Review of Systems  Constitutional:  Negative for fatigue and unexpected weight change.  HENT:  Negative for nosebleeds.   Eyes:  Negative for visual disturbance.  Respiratory:  Negative for cough, chest tightness, shortness of breath and wheezing.   Cardiovascular:  Negative for chest pain, palpitations and leg swelling.  Gastrointestinal:  Negative for abdominal pain, constipation and diarrhea.  Neurological:  Negative for dizziness, weakness, light-headedness and headaches.  Psychiatric/Behavioral:  Negative for dysphoric mood and sleep disturbance. The patient is not nervous/anxious.      Lab Results  Component Value Date   NA 141 10/08/2023   K 4.4 10/08/2023   CO2 25 10/08/2023   GLUCOSE 85 10/08/2023   BUN 24 10/08/2023   CREATININE 1.13 10/08/2023   CALCIUM  9.4 10/08/2023   EGFR 69 10/08/2023   GFRNONAA 63 09/24/2020   Lab Results  Component Value Date   CHOL 169 10/08/2023   HDL 56 10/08/2023   LDLCALC 96 10/08/2023   TRIG 92 10/08/2023   CHOLHDL 3.0 10/08/2023   No results found for: "TSH" Lab Results  Component Value Date   HGBA1C 5.4 10/01/2021   Lab Results  Component Value Date   WBC 5.8 10/08/2023   HGB 15.5 10/08/2023   HCT 47.4 10/08/2023   MCV 93 10/08/2023   PLT 266 10/08/2023   Lab Results  Component Value Date   ALT 19 10/08/2023   AST 20 10/08/2023   ALKPHOS 101 10/08/2023   BILITOT 0.8 10/08/2023   Lab Results  Component Value Date   VD25OH 34.0 10/08/2023     Patient Active Problem List   Diagnosis Date Noted   History of colonic polyps 10/23/2023    Polyp of ascending colon 10/23/2023   Essential hypertension 05/08/2023   Benign microscopic hematuria 09/24/2020   Benign neoplasm of transverse colon    Pain of left hip joint 09/21/2018   Partial tear of left rotator cuff 04/21/2017   Nontraumatic rupture of long head of biceps tendon 04/21/2017   Osteoarthritis of knee 04/21/2017   Allergic rhinitis 04/21/2017   Vitamin D  deficiency, unspecified 06/25/2015   Overweight (BMI 25.0-29.9) 06/08/2015   Mixed hyperlipidemia 06/08/2015    No Known Allergies  Past Surgical History:  Procedure Laterality Date   CHOLECYSTECTOMY  1986   COLONOSCOPY  2007   divert- [place] [facility]   COLONOSCOPY WITH PROPOFOL  N/A 10/15/2018   Procedure: COLONOSCOPY WITH PROPOFOL ;  Surgeon: Marnee Sink, MD;  Location: [facility];  Service: Endoscopy;  Laterality: N/A;   COLONOSCOPY WITH PROPOFOL  N/A 10/23/2023   Procedure: COLONOSCOPY WITH PROPOFOL  WITH POLYPECTOMY;  Surgeon: Marnee Sink, MD;  Location: [facility];  Service: Endoscopy;  Laterality: N/A;   HERNIA REPAIR  2008   right inguinal   KNEE ARTHROSCOPY     POLYPECTOMY  10/15/2018   Procedure: POLYPECTOMY;  Surgeon: Marnee Sink, MD;  Location: [facility];  Service: Endoscopy;;   SHOULDER SURGERY     TONSILLECTOMY      Social History   Tobacco Use   Smoking status: Never   Smokeless tobacco: Never  Vaping Use   Vaping status:  Never Used  Substance Use Topics   Alcohol use: Not Currently    Alcohol/week: 0.0 standard drinks of alcohol    Comment: once monthly- rare   Drug use: No     Medication list has been reviewed and updated.  Current Meds  Medication Sig   cetirizine  (ZYRTEC ) 10 MG tablet Take 1 tablet (10 mg total) by mouth daily as needed for allergies.   Cholecalciferol (VITAMIN D -3) 1000 units CAPS Take 1 capsule (1,000 Units total) by mouth daily.   ibuprofen (ADVIL,MOTRIN) 200 MG tablet Take by mouth every 6 (six) hours as needed.   rosuvastatin   (CRESTOR ) 5 MG tablet TAKE 1 TABLET BY MOUTH EVERY DAY   [DISCONTINUED] losartan  (COZAAR ) 25 MG tablet Take 1 tablet (25 mg total) by mouth daily.       12/14/2023    3:33 PM 05/08/2023    2:11 PM 10/03/2022    9:54 AM 10/01/2021    8:06 AM  GAD 7 : Generalized Anxiety Score  Nervous, Anxious, on Edge 0 0 0 0  Control/stop worrying 0 0 0 0  Worry too much - different things 0 0 0 0  Trouble relaxing 0 0 0 0  Restless 0 0 0 0  Easily annoyed or irritable 0 0 0 0  Afraid - awful might happen 0 0 0 0  Total GAD 7 Score 0 0 0 0  Anxiety Difficulty Not difficult at all Not difficult at all Not difficult at all Not difficult at all       12/14/2023    3:33 PM 05/08/2023    2:11 PM 12/25/2022    8:16 AM  Depression screen PHQ 2/9  Decreased Interest 0 0 0  Down, Depressed, Hopeless 0 0 0  PHQ - 2 Score 0 0 0  Altered sleeping 0 0 0  Tired, decreased energy 0 0 0  Change in appetite 0 0 0  Feeling bad or failure about yourself  0 0 0  Trouble concentrating 0 0 0  Moving slowly or fidgety/restless 0 0 0  Suicidal thoughts 0 0 0  PHQ-9 Score 0 0 0  Difficult doing work/chores Not difficult at all Not difficult at all Not difficult at all    BP Readings from Last 3 Encounters:  12/14/23 132/86  10/23/23 100/76  10/08/23 128/84    Physical Exam Vitals and nursing note reviewed.  Constitutional:      General: He is not in acute distress.    Appearance: Normal appearance. He is well-developed.  HENT:     Head: Normocephalic and atraumatic.  Cardiovascular:     Rate and Rhythm: Normal rate and regular rhythm.  Pulmonary:     Effort: Pulmonary effort is normal. No respiratory distress.     Breath sounds: No wheezing or rhonchi.  Musculoskeletal:     Cervical back: Normal range of motion.  Skin:    General: Skin is warm and dry.     Findings: No rash.  Neurological:     Mental Status: He is alert and oriented to person, place, and time.  Psychiatric:        Mood and Affect:  Mood normal.        Behavior: Behavior normal.     Wt Readings from Last 3 Encounters:  12/14/23 197 lb (89.4 kg)  10/23/23 183 lb (83 kg)  10/08/23 192 lb (87.1 kg)    BP 132/86   Pulse 71   Ht 6' (1.829 m)   [employer]  197 lb (89.4 kg)   SpO2 97%   BMI 26.72 kg/m   Assessment and Plan:  Problem List Items Addressed This Visit       Unprioritized   Essential hypertension   improved BP with normal exam. Current regimen is losartan  25 mg. Continue exercise and healthy diet Increase to losartan  50 mg daily       Relevant Medications   losartan  (COZAAR ) 50 MG tablet    Return in about 2 months (around 02/11/2024) for HTN.    Sheron Dixons, MD [facility] [place]

## 2024-01-07 ENCOUNTER — Ambulatory Visit: Payer: PPO | Admitting: Emergency Medicine

## 2024-01-07 VITALS — Ht 72.0 in | Wt 188.0 lb

## 2024-01-07 DIAGNOSIS — Z0000 Encounter for general adult medical examination without abnormal findings: Secondary | ICD-10-CM | POA: Diagnosis not present

## 2024-01-07 NOTE — Progress Notes (Signed)
 Subjective:   Anthony Foley is a 73 y.o. who presents for a Medicare Wellness preventive visit.  Visit Complete: Virtual I connected with  [name] on 01/07/24 by a audio enabled telemedicine application and verified that I am speaking with the correct person using two identifiers.  Patient Location: Home  Provider Location: Home Office  I discussed the limitations of evaluation and management by telemedicine. The patient expressed understanding and agreed to proceed.  Vital Signs: Because this visit was a virtual/telehealth visit, some criteria may be missing or patient reported. Any vitals not documented were not able to be obtained and vitals that have been documented are patient reported.  VideoDeclined- This patient declined Librarian, academic. Therefore the visit was completed with audio only.  AWV Questionnaire: Yes: Patient Medicare AWV questionnaire was completed by the patient on 01/07/24; I have confirmed that all information answered by patient is correct and no changes since this date.  Cardiac Risk Factors include: advanced age (>51men, >72 women);male gender;dyslipidemia;hypertension     Objective:    Today's Vitals   01/07/24 1305  Weight: 188 lb (85.3 kg)  Height: 6' (1.829 m)   Body mass index is 25.5 kg/m.     01/07/2024    1:14 PM 10/23/2023    7:21 AM 12/25/2022    8:18 AM 06/21/2022   12:43 PM 12/18/2021    8:08 AM 12/17/2020    8:29 AM 05/08/2020   12:08 PM  Advanced Directives  Does Patient Have a Medical Advance Directive? Yes No No No No No No  Type of Estate agent of [place];Living will        Does patient want to make changes to medical advance directive? No - Patient declined        Copy of Healthcare Power of Attorney in Chart? No - copy requested        Would patient like information on creating a medical advance directive?  No - Patient declined No - Patient declined  No - Patient  declined No - Patient declined No - Patient declined    Current Medications (verified) Outpatient Encounter Medications as of 01/07/2024  Medication Sig   cetirizine (ZYRTEC) 10 MG tablet Take 1 tablet (10 mg total) by mouth daily as needed for allergies.   Cholecalciferol (VITAMIN D-3) 1000 units CAPS Take 1 capsule (1,000 Units total) by mouth daily.   ibuprofen (ADVIL,MOTRIN) 200 MG tablet Take by mouth every 6 (six) hours as needed.   losartan (COZAAR) 50 MG tablet Take 1 tablet (50 mg total) by mouth daily.   rosuvastatin (CRESTOR) 5 MG tablet TAKE 1 TABLET BY MOUTH EVERY DAY   No facility-administered encounter medications on file as of 01/07/2024.    Allergies (verified) Patient has no known allergies.   History: Past Medical History:  Diagnosis Date   Borderline hypertension    Hyperlipidemia    Shoulder injury    Past Surgical History:  Procedure Laterality Date   CHOLECYSTECTOMY  1986   COLONOSCOPY  2007   divert- [place] [facility]   COLONOSCOPY WITH PROPOFOL N/A 10/15/2018   Procedure: COLONOSCOPY WITH PROPOFOL;  Surgeon: Midge Minium, MD;  Location: [facility];  Service: Endoscopy;  Laterality: N/A;   COLONOSCOPY WITH PROPOFOL N/A 10/23/2023   Procedure: COLONOSCOPY WITH PROPOFOL WITH POLYPECTOMY;  Surgeon: Midge Minium, MD;  Location: [facility];  Service: Endoscopy;  Laterality: N/A;   HERNIA REPAIR  2008   right inguinal  KNEE ARTHROSCOPY     POLYPECTOMY  10/15/2018   Procedure: POLYPECTOMY;  Surgeon: Midge Minium, MD;  Location: [facility];  Service: Endoscopy;;   SHOULDER SURGERY     TONSILLECTOMY     Family History  Problem Relation Age of Onset   [name] Parkinson White syndrome Mother    Heart disease Father 88       lived to 75   Uterine cancer Sister    Birth defects Paternal Grandfather    COPD Maternal Aunt    Social History   Socioeconomic History   Marital status: Married    Spouse name: Misty Stanley   Number of children: 2    Years of education: Not on file   Highest education level: Some college, no degree  Occupational History   Occupation: retired  Tobacco Use   Smoking status: Never   Smokeless tobacco: Never  Vaping Use   Vaping status: Never Used  Substance and Sexual Activity   Alcohol use: Yes    Comment: 1 beer twice a year   Drug use: No   Sexual activity: Yes  Other Topics Concern   Not on file  Social History Narrative   Not on file   Social Drivers of Health   Financial Resource Strain: Low Risk  (01/07/2024)   Overall Financial Resource Strain (CARDIA)    Difficulty of Paying Living Expenses: Not hard at all  Food Insecurity: No Food Insecurity (01/07/2024)   Hunger Vital Sign    Worried About Running Out of Food in the Last Year: Never true    Ran Out of Food in the Last Year: Never true  Transportation Needs: No Transportation Needs (01/07/2024)   PRAPARE - Administrator, Civil Service (Medical): No    Lack of Transportation (Non-Medical): No  Physical Activity: Sufficiently Active (01/07/2024)   Exercise Vital Sign    Days of Exercise per Week: 6 days    Minutes of Exercise per Session: 40 min  Stress: No Stress Concern Present (01/07/2024)   [employer] of Occupational Health - Occupational Stress Questionnaire    Feeling of Stress : Not at all  Social Connections: Socially Integrated (01/07/2024)   Social Connection and Isolation Panel [NHANES]    Frequency of Communication with Friends and Family: More than three times a week    Frequency of Social Gatherings with Friends and Family: More than three times a week    Attends Religious Services: More than 4 times per year    Active Member of [employer] or Organizations: Yes    Attends Engineer, structural: More than 4 times per year    Marital Status: Married    Tobacco Counseling Counseling given: Not Answered    Clinical Intake:  Pre-visit preparation completed: Yes  Pain : No/denies pain     BMI -  recorded: 25.5 Nutritional Status: BMI 25 -29 Overweight Nutritional Risks: None Diabetes: No  How often do you need to have someone help you when you read instructions, pamphlets, or other written materials from your doctor or pharmacy?: 1 - Never  Interpreter Needed?: No  Information entered by :: Tora Kindred, CMA   Activities of Daily Living     01/07/2024    1:06 PM 01/07/2024   11:24 AM  In your present state of health, do you have any difficulty performing the following activities:  Hearing? 0 0  Vision? 0 0  Difficulty concentrating or making decisions? 0 0  Walking or climbing stairs?  0 0  Dressing or bathing? 0 0  Doing errands, shopping? 0 0  Preparing Food and eating ? N N  Using the Toilet? N N  In the past six months, have you accidently leaked urine? N N  Do you have problems with loss of bowel control? N N  Managing your Medications? N N  Managing your Finances? N N  Housekeeping or managing your Housekeeping? N N    Patient Care Team: [name], MD as PCP - General (Internal Medicine) Jesusita Oka, MD (Dermatology) Midge Minium, MD as Consulting Physician (Gastroenterology)  Indicate any recent Medical Services you may have received from other than [facility] providers in the past year (date may be approximate).     Assessment:   This is a routine wellness examination for Arvie.  Hearing/Vision screen Hearing Screening - Comments:: Denies hearing loss Vision Screening - Comments:: Gets eye exams, Dr Verne Carrow, [facility] [place]   Goals Addressed             This Visit's Progress    Patient Stated       Lose 5 lbs       Depression Screen     01/07/2024    1:11 PM 12/14/2023    3:33 PM 05/08/2023    2:11 PM 12/25/2022    8:16 AM 10/03/2022    9:54 AM 12/18/2021    8:08 AM 10/01/2021    8:06 AM  PHQ 2/9 Scores  PHQ - 2 Score 0 0 0 0 0 0 0  PHQ- 9 Score 0 0 0 0 0  0    Fall Risk     01/07/2024    1:15 PM 01/07/2024   11:24 AM  12/14/2023    3:33 PM 05/08/2023    2:11 PM 12/25/2022    8:19 AM  Fall Risk   Falls in the past year? 0 0 0 0 0  Number falls in past yr: 0  0 0 0  Injury with Fall? 0  0 0 0  Risk for fall due to : No Fall Risks  No Fall Risks No Fall Risks No Fall Risks  Follow up Falls prevention discussed;Falls evaluation completed  Falls evaluation completed Falls evaluation completed Falls prevention discussed;Falls evaluation completed    MEDICARE RISK AT HOME:  Medicare Risk at Home Any stairs in or around the home?: Yes If so, are there any without handrails?: No Home free of loose throw rugs in walkways, pet beds, electrical cords, etc?: Yes Adequate lighting in your home to reduce risk of falls?: Yes Life alert?: No Use of a cane, walker or w/c?: No Grab bars in the bathroom?: No Shower chair or bench in shower?: Yes Elevated toilet seat or a handicapped toilet?: No  TIMED UP AND GO:  Was the test performed?  No  Cognitive Function: 6CIT completed        01/07/2024    1:15 PM 12/25/2022    8:23 AM 09/14/2019   10:14 AM  6CIT Screen  What Year? 0 points 0 points 0 points  What month? 0 points 0 points 0 points  What time? 0 points 0 points 0 points  Count back from 20 0 points 0 points 0 points  Months in reverse 0 points 0 points 0 points  Repeat phrase 0 points 0 points 0 points  Total Score 0 points 0 points 0 points    Immunizations Immunization History  Administered Date(s) Administered   Fluad Quad(high Dose 65+) 10/03/2022  Influenza, High Dose Seasonal PF 10/21/2017, 09/21/2018, 08/26/2019   Influenza,inj,Quad PF,6+ Mos 09/03/2015, 09/04/2016   Influenza-Unspecified 08/14/2020, 08/09/2021, 08/27/2023   PFIZER(Purple Top)SARS-COV-2 Vaccination 12/13/2019, 01/03/2020, 08/14/2020   Pfizer Covid-19 Vaccine Bivalent Booster 77yrs & up 08/09/2021   Pneumococcal Conjugate-13 10/21/2017   Pneumococcal Polysaccharide-23 11/22/2018   Tdap 10/15/2016   Zoster  Recombinant(Shingrix) 08/26/2019, 10/31/2019    Screening Tests Health Maintenance  Topic Date Due   COVID-19 Vaccine (5 - 2024-25 season) 07/05/2023   Medicare Annual Wellness (AWV)  01/06/2025   DTaP/Tdap/Td (2 - Td or Tdap) 10/15/2026   Colonoscopy  10/22/2030   Pneumonia Vaccine 11+ Years old  Completed   INFLUENZA VACCINE  Completed   Hepatitis C Screening  Completed   Zoster Vaccines- Shingrix  Completed   HPV VACCINES  Aged Out    Health Maintenance  Health Maintenance Due  Topic Date Due   COVID-19 Vaccine (5 - 2024-25 season) 07/05/2023   Health Maintenance Items Addressed: See Nurse Notes  Additional Screening:  Vision Screening: Recommended annual ophthalmology exams for early detection of glaucoma and other disorders of the eye.  Dental Screening: Recommended annual dental exams for proper oral hygiene  Community Resource Referral / Chronic Care Management: CRR required this visit?  No   CCM required this visit?  No     Plan:     I have personally reviewed and noted the following in the patient's chart:   Medical and social history Use of alcohol, tobacco or illicit drugs  Current medications and supplements including opioid prescriptions. Patient is not currently taking opioid prescriptions. Functional ability and status Nutritional status Physical activity Advanced directives List of other physicians Hospitalizations, surgeries, and ER visits in previous 12 months Vitals Screenings to include cognitive, depression, and falls Referrals and appointments  In addition, I have reviewed and discussed with patient certain preventive protocols, quality metrics, and best practice recommendations. A written personalized care plan for preventive services as well as general preventive health recommendations were provided to patient.     Tora Kindred, CMA   01/07/2024   After Visit Summary: (MyChart) Due to this being a telephonic visit, the after visit  summary with patients personalized plan was offered to patient via MyChart   Notes:  Declined covid vaccine

## 2024-01-07 NOTE — Patient Instructions (Addendum)
 Anthony Foley , Thank you for taking time to come for your Medicare Wellness Visit. I appreciate your ongoing commitment to your health goals. Please review the following plan we discussed and let me know if I can assist you in the future.   Referrals/Orders/Follow-Ups/Clinician Recommendations: Keep up the good work!!  This is a list of the screening recommended for you and due dates:  Health Maintenance  Topic Date Due   COVID-19 Vaccine (5 - 2024-25 season) 07/05/2023   Medicare Annual Wellness Visit  01/06/2025   DTaP/Tdap/Td vaccine (2 - Td or Tdap) 10/15/2026   Colon Cancer Screening  10/22/2030   Pneumonia Vaccine  Completed   Flu Shot  Completed   Hepatitis C Screening  Completed   Zoster (Shingles) Vaccine  Completed   HPV Vaccine  Aged Out    Advanced directives: (Copy Requested) Please bring a copy of your health care power of attorney and living will to the office to be added to your chart at your convenience.  Next Medicare Annual Wellness Visit scheduled for next year: Yes, 01/19/25 @ 1:10pm (phone visit)

## 2024-02-17 ENCOUNTER — Encounter: Payer: Self-pay | Admitting: Internal Medicine

## 2024-02-17 ENCOUNTER — Ambulatory Visit (INDEPENDENT_AMBULATORY_CARE_PROVIDER_SITE_OTHER): Payer: PPO | Admitting: Internal Medicine

## 2024-02-17 VITALS — BP 124/78 | HR 64 | Ht 72.0 in | Wt 194.0 lb

## 2024-02-17 DIAGNOSIS — I10 Essential (primary) hypertension: Secondary | ICD-10-CM

## 2024-02-17 NOTE — Progress Notes (Signed)
 Date:  02/17/2024   Name:  Anthony Foley   DOB:  May 06, 1951   MRN:  [id number]   Chief Complaint: Hypertension  Hypertension This is a chronic problem. The problem is controlled. Associated symptoms include palpitations (occasional brief). Pertinent negatives include no chest pain, headaches or shortness of breath. Risk factors for coronary artery disease include dyslipidemia. Past treatments include angiotensin blockers. The current treatment provides significant improvement. There are no compliance problems.  There is no history of kidney disease, CAD/MI or CVA.    Review of Systems  Constitutional:  Negative for chills, fatigue and unexpected weight change.  HENT:  Negative for nosebleeds.   Eyes:  Negative for visual disturbance.  Respiratory:  Negative for cough, chest tightness, shortness of breath and wheezing.   Cardiovascular:  Positive for palpitations (occasional brief). Negative for chest pain and leg swelling.  Gastrointestinal:  Negative for abdominal pain, constipation and diarrhea.  Musculoskeletal:  Positive for arthralgias (knee issues improving.).  Neurological:  Negative for dizziness, weakness, light-headedness and headaches.     Lab Results  Component Value Date   NA 141 10/08/2023   K 4.4 10/08/2023   CO2 25 10/08/2023   GLUCOSE 85 10/08/2023   BUN 24 10/08/2023   CREATININE 1.13 10/08/2023   CALCIUM 9.4 10/08/2023   EGFR 69 10/08/2023   GFRNONAA 63 09/24/2020   Lab Results  Component Value Date   CHOL 169 10/08/2023   HDL 56 10/08/2023   LDLCALC 96 10/08/2023   TRIG 92 10/08/2023   CHOLHDL 3.0 10/08/2023   No results found for: "TSH" Lab Results  Component Value Date   HGBA1C 5.4 10/01/2021   Lab Results  Component Value Date   WBC 5.8 10/08/2023   HGB 15.5 10/08/2023   HCT 47.4 10/08/2023   MCV 93 10/08/2023   PLT 266 10/08/2023   Lab Results  Component Value Date   ALT 19 10/08/2023   AST 20 10/08/2023   ALKPHOS 101  10/08/2023   BILITOT 0.8 10/08/2023   Lab Results  Component Value Date   VD25OH 34.0 10/08/2023     Patient Active Problem List   Diagnosis Date Noted   History of colonic polyps 10/23/2023   Polyp of ascending colon 10/23/2023   Essential hypertension 05/08/2023   Benign microscopic hematuria 09/24/2020   Benign neoplasm of transverse colon    Pain of left hip joint 09/21/2018   Partial tear of left rotator cuff 04/21/2017   Nontraumatic rupture of long head of biceps tendon 04/21/2017   Osteoarthritis of knee 04/21/2017   Allergic rhinitis 04/21/2017   Vitamin D deficiency, unspecified 06/25/2015   Overweight (BMI 25.0-29.9) 06/08/2015   Mixed hyperlipidemia 06/08/2015    No Known Allergies  Past Surgical History:  Procedure Laterality Date   CHOLECYSTECTOMY  1986   COLONOSCOPY  2007   divert- [place] [facility]   COLONOSCOPY WITH PROPOFOL N/A 10/15/2018   Procedure: COLONOSCOPY WITH PROPOFOL;  Surgeon: Midge Minium, MD;  Location: [facility];  Service: Endoscopy;  Laterality: N/A;   COLONOSCOPY WITH PROPOFOL N/A 10/23/2023   Procedure: COLONOSCOPY WITH PROPOFOL WITH POLYPECTOMY;  Surgeon: Midge Minium, MD;  Location: [facility];  Service: Endoscopy;  Laterality: N/A;   HERNIA REPAIR  2008   right inguinal   KNEE ARTHROSCOPY     POLYPECTOMY  10/15/2018   Procedure: POLYPECTOMY;  Surgeon: Midge Minium, MD;  Location: [facility];  Service: Endoscopy;;   SHOULDER SURGERY     TONSILLECTOMY  Social History   Tobacco Use   Smoking status: Never   Smokeless tobacco: Never  Vaping Use   Vaping status: Never Used  Substance Use Topics   Alcohol use: Yes    Comment: 1 beer twice a year   Drug use: No     Medication list has been reviewed and updated.  Current Meds  Medication Sig   cetirizine (ZYRTEC) 10 MG tablet Take 1 tablet (10 mg total) by mouth daily as needed for allergies.   Cholecalciferol (VITAMIN D-3) 1000 units CAPS Take 1  capsule (1,000 Units total) by mouth daily.   ibuprofen (ADVIL,MOTRIN) 200 MG tablet Take by mouth every 6 (six) hours as needed.   losartan (COZAAR) 50 MG tablet Take 1 tablet (50 mg total) by mouth daily.   rosuvastatin (CRESTOR) 5 MG tablet TAKE 1 TABLET BY MOUTH EVERY DAY       02/17/2024    1:23 PM 12/14/2023    3:33 PM 05/08/2023    2:11 PM 10/03/2022    9:54 AM  GAD 7 : Generalized Anxiety Score  Nervous, Anxious, on Edge 0 0 0 0  Control/stop worrying 0 0 0 0  Worry too much - different things 0 0 0 0  Trouble relaxing 0 0 0 0  Restless 0 0 0 0  Easily annoyed or irritable 0 0 0 0  Afraid - awful might happen 0 0 0 0  Total GAD 7 Score 0 0 0 0  Anxiety Difficulty Not difficult at all Not difficult at all Not difficult at all Not difficult at all       02/17/2024    1:22 PM 01/07/2024    1:11 PM 12/14/2023    3:33 PM  Depression screen PHQ 2/9  Decreased Interest 0 0 0  Down, Depressed, Hopeless 0 0 0  PHQ - 2 Score 0 0 0  Altered sleeping 0 0 0  Tired, decreased energy 0 0 0  Change in appetite 0 0 0  Feeling bad or failure about yourself  0 0 0  Trouble concentrating 0 0 0  Moving slowly or fidgety/restless 0 0 0  Suicidal thoughts 0 0 0  PHQ-9 Score 0 0 0  Difficult doing work/chores Not difficult at all Not difficult at all Not difficult at all    BP Readings from Last 3 Encounters:  02/17/24 124/78  12/14/23 132/86  10/23/23 100/76    Physical Exam Vitals and nursing note reviewed.  Constitutional:      General: He is not in acute distress.    Appearance: Normal appearance. He is well-developed.  HENT:     Head: Normocephalic and atraumatic.  Cardiovascular:     Rate and Rhythm: Normal rate and regular rhythm.     Pulses: Normal pulses.     Heart sounds: No murmur heard. Pulmonary:     Effort: Pulmonary effort is normal. No respiratory distress.     Breath sounds: No wheezing or rhonchi.  Musculoskeletal:     Right lower leg: No edema.     Left  lower leg: No edema.  Lymphadenopathy:     Cervical: No cervical adenopathy.  Skin:    General: Skin is warm and dry.     Findings: No rash.  Neurological:     Mental Status: He is alert and oriented to person, place, and time.  Psychiatric:        Mood and Affect: Mood normal.        Behavior: Behavior  normal.     Wt Readings from Last 3 Encounters:  02/17/24 194 lb (88 kg)  01/07/24 188 lb (85.3 kg)  12/14/23 197 lb (89.4 kg)    BP 124/78   Pulse 64   Ht 6' (1.829 m)   Wt 194 lb (88 kg)   SpO2 100%   BMI 26.31 kg/m   Assessment and Plan:  Problem List Items Addressed This Visit       Unprioritized   Essential hypertension - Primary (Chronic)   Blood pressure is well controlled.  Current medications losartan 50 mg.  Dose increased last visit with good response and no side effects. Will continue same regimen along with efforts to limit dietary sodium.        Return in about 8 months (around 10/18/2024) for CPX.    Sheron Dixons, MD [facility] [place]

## 2024-02-17 NOTE — Assessment & Plan Note (Signed)
 Blood pressure is well controlled.  Current medications losartan 50 mg.  Dose increased last visit with good response and no side effects. Will continue same regimen along with efforts to limit dietary sodium.

## 2024-06-09 ENCOUNTER — Other Ambulatory Visit: Payer: Self-pay | Admitting: Internal Medicine

## 2024-06-09 DIAGNOSIS — I10 Essential (primary) hypertension: Secondary | ICD-10-CM

## 2024-06-10 NOTE — Telephone Encounter (Signed)
 Requested Prescriptions  Pending Prescriptions Disp Refills   losartan  (COZAAR ) 50 MG tablet [Pharmacy Med Name: LOSARTAN  POTASSIUM 50 MG TAB] 90 tablet 1    Sig: TAKE 1 TABLET BY MOUTH EVERY DAY     Cardiovascular:  Angiotensin Receptor Blockers Failed - 06/10/2024 11:16 PM      Failed - Cr in normal range and within 180 days    Creatinine, Ser  Date Value Ref Range Status  10/08/2023 1.13 0.76 - 1.27 mg/dL Final         Failed - K in normal range and within 180 days    Potassium  Date Value Ref Range Status  10/08/2023 4.4 3.5 - 5.2 mmol/L Final         Passed - Patient is not pregnant      Passed - Last BP in normal range    BP Readings from Last 1 Encounters:  02/17/24 124/78         Passed - Valid encounter within last 6 months    Recent Outpatient Visits           3 months ago Essential hypertension   [place] [facility] at [facility], Leita DEL, MD   5 months ago Essential hypertension   [facility] at [facility], Leita DEL, MD       Future Appointments             In 4 months Justus, Leita DEL, MD [facility] at [facility], [facility]

## 2024-08-04 ENCOUNTER — Other Ambulatory Visit: Payer: Self-pay | Admitting: Internal Medicine

## 2024-08-04 DIAGNOSIS — E782 Mixed hyperlipidemia: Secondary | ICD-10-CM

## 2024-08-05 NOTE — Telephone Encounter (Signed)
 Requested Prescriptions  Pending Prescriptions Disp Refills   rosuvastatin  (CRESTOR ) 5 MG tablet [Pharmacy Med Name: ROSUVASTATIN  CALCIUM  5 MG TAB] 90 tablet 0    Sig: TAKE 1 TABLET BY MOUTH EVERY DAY     Cardiovascular:  Antilipid - Statins 2 Failed - 08/05/2024 11:16 AM      Failed - Lipid Panel in normal range within the last 12 months    Cholesterol, Total  Date Value Ref Range Status  10/08/2023 169 100 - 199 mg/dL Final   LDL Chol Calc (NIH)  Date Value Ref Range Status  10/08/2023 96 0 - 99 mg/dL Final   HDL  Date Value Ref Range Status  10/08/2023 56 >39 mg/dL Final   Triglycerides  Date Value Ref Range Status  10/08/2023 92 0 - 149 mg/dL Final         Passed - Cr in normal range and within 360 days    Creatinine, Ser  Date Value Ref Range Status  10/08/2023 1.13 0.76 - 1.27 mg/dL Final         Passed - Patient is not pregnant      Passed - Valid encounter within last 12 months    Recent Outpatient Visits           5 months ago Essential hypertension   [place] [facility] at [facility], Leita DEL, MD   7 months ago Essential hypertension   [place] [facility] at [facility], Leita DEL, MD       Future Appointments             In 2 months Justus, Leita DEL, MD [facility] Medicine at [facility], [phone] [place]

## 2024-08-09 ENCOUNTER — Ambulatory Visit (INDEPENDENT_AMBULATORY_CARE_PROVIDER_SITE_OTHER): Admitting: Internal Medicine

## 2024-08-09 VITALS — BP 120/62 | HR 71 | Ht 72.0 in | Wt 192.0 lb

## 2024-08-09 DIAGNOSIS — D485 Neoplasm of uncertain behavior of skin: Secondary | ICD-10-CM

## 2024-08-09 DIAGNOSIS — Z23 Encounter for immunization: Secondary | ICD-10-CM

## 2024-08-09 NOTE — Progress Notes (Signed)
 Date:  08/09/2024   Name:  Anthony Foley   DOB:  03/30/1951   MRN:  [id number]   Chief Complaint: Nevus  HPI Skin lesion - new lesion on chest, itching and painful if scratched.  He is not sure how long it has been there.  He has seen [facility] in the past for non malignant findings.  Review of Systems  Constitutional:  Negative for chills and fatigue.  Respiratory:  Negative for chest tightness and shortness of breath.   Cardiovascular:  Negative for chest pain.  Skin:        Chest and back lesions     Lab Results  Component Value Date   NA 141 10/08/2023   K 4.4 10/08/2023   CO2 25 10/08/2023   GLUCOSE 85 10/08/2023   BUN 24 10/08/2023   CREATININE 1.13 10/08/2023   CALCIUM  9.4 10/08/2023   EGFR 69 10/08/2023   GFRNONAA 63 09/24/2020   Lab Results  Component Value Date   CHOL 169 10/08/2023   HDL 56 10/08/2023   LDLCALC 96 10/08/2023   TRIG 92 10/08/2023   CHOLHDL 3.0 10/08/2023   No results found for: TSH Lab Results  Component Value Date   HGBA1C 5.4 10/01/2021   Lab Results  Component Value Date   WBC 5.8 10/08/2023   HGB 15.5 10/08/2023   HCT 47.4 10/08/2023   MCV 93 10/08/2023   PLT 266 10/08/2023   Lab Results  Component Value Date   ALT 19 10/08/2023   AST 20 10/08/2023   ALKPHOS 101 10/08/2023   BILITOT 0.8 10/08/2023   Lab Results  Component Value Date   VD25OH 34.0 10/08/2023     Patient Active Problem List   Diagnosis Date Noted   History of colonic polyps 10/23/2023   Polyp of ascending colon 10/23/2023   Essential hypertension 05/08/2023   Benign microscopic hematuria 09/24/2020   Benign neoplasm of transverse colon    Pain of left hip joint 09/21/2018   Partial tear of left rotator cuff 04/21/2017   Nontraumatic rupture of long head of biceps tendon 04/21/2017   Osteoarthritis of knee 04/21/2017   Allergic rhinitis 04/21/2017   Vitamin D  deficiency, unspecified 06/25/2015   Overweight (BMI 25.0-29.9)  06/08/2015   Mixed hyperlipidemia 06/08/2015    No Known Allergies  Past Surgical History:  Procedure Laterality Date   CHOLECYSTECTOMY  1986   COLONOSCOPY  2007   divert- [place] [facility]   COLONOSCOPY WITH PROPOFOL  N/A 10/15/2018   Procedure: COLONOSCOPY WITH PROPOFOL ;  Surgeon: Jinny Carmine, MD;  Location: [facility];  Service: Endoscopy;  Laterality: N/A;   COLONOSCOPY WITH PROPOFOL  N/A 10/23/2023   Procedure: COLONOSCOPY WITH PROPOFOL  WITH POLYPECTOMY;  Surgeon: Jinny Carmine, MD;  Location: [facility];  Service: Endoscopy;  Laterality: N/A;   HERNIA REPAIR  2008   right inguinal   KNEE ARTHROSCOPY     POLYPECTOMY  10/15/2018   Procedure: POLYPECTOMY;  Surgeon: Jinny Carmine, MD;  Location: [facility];  Service: Endoscopy;;   SHOULDER SURGERY     TONSILLECTOMY      Social History   Tobacco Use   Smoking status: Never   Smokeless tobacco: Never  Vaping Use   Vaping status: Never Used  Substance Use Topics   Alcohol use: Yes    Comment: 1 beer twice a year   Drug use: No     Medication list has been reviewed and updated.  Current Meds  Medication Sig  cetirizine  (ZYRTEC ) 10 MG tablet Take 1 tablet (10 mg total) by mouth daily as needed for allergies.   Cholecalciferol (VITAMIN D -3) 1000 units CAPS Take 1 capsule (1,000 Units total) by mouth daily.   ibuprofen (ADVIL,MOTRIN) 200 MG tablet Take by mouth every 6 (six) hours as needed.   losartan  (COZAAR ) 50 MG tablet TAKE 1 TABLET BY MOUTH EVERY DAY   rosuvastatin  (CRESTOR ) 5 MG tablet TAKE 1 TABLET BY MOUTH EVERY DAY       08/09/2024    1:18 PM 02/17/2024    1:23 PM 12/14/2023    3:33 PM 05/08/2023    2:11 PM  GAD 7 : Generalized Anxiety Score  Nervous, Anxious, on Edge 0 0 0 0  Control/stop worrying 0 0 0 0  Worry too much - different things 0 0 0 0  Trouble relaxing 0 0 0 0  Restless 0 0 0 0  Easily annoyed or irritable 0 0 0 0  Afraid - awful might happen 0 0 0 0  Total GAD 7 Score 0  0 0 0  Anxiety Difficulty Not difficult at all Not difficult at all Not difficult at all Not difficult at all       08/09/2024    1:18 PM 02/17/2024    1:22 PM 01/07/2024    1:11 PM  Depression screen PHQ 2/9  Decreased Interest 0 0 0  Down, Depressed, Hopeless 0 0 0  PHQ - 2 Score 0 0 0  Altered sleeping 0 0 0  Tired, decreased energy 0 0 0  Change in appetite 0 0 0  Feeling bad or failure about yourself  0 0 0  Trouble concentrating 0 0 0  Moving slowly or fidgety/restless 0 0 0  Suicidal thoughts 0 0 0  PHQ-9 Score 0 0 0  Difficult doing work/chores Not difficult at all Not difficult at all Not difficult at all    BP Readings from Last 3 Encounters:  08/09/24 120/62  02/17/24 124/78  12/14/23 132/86    Physical Exam Vitals and nursing note reviewed.  Constitutional:      General: He is not in acute distress.    Appearance: Normal appearance. He is well-developed.  HENT:     Head: Normocephalic and atraumatic.  Pulmonary:     Effort: Pulmonary effort is normal. No respiratory distress.  Skin:    General: Skin is warm and dry.     Findings: No rash.      Neurological:     Mental Status: He is alert and oriented to person, place, and time.  Psychiatric:        Mood and Affect: Mood normal.        Behavior: Behavior normal.     Wt Readings from Last 3 Encounters:  08/09/24 192 lb (87.1 kg)  02/17/24 194 lb (88 kg)  01/07/24 188 lb (85.3 kg)    BP 120/62   Pulse 71   Ht 6' (1.829 m)   Wt 192 lb (87.1 kg)   SpO2 96%   BMI 26.04 kg/m   Assessment and Plan:  Problem List Items Addressed This Visit   None Visit Diagnoses       Neoplasm of uncertain behavior of skin    -  Primary   non inflamed keratosis multiple angiomas, pigmented flat lesions and keratoses   Relevant Orders   Ambulatory referral to Dermatology     Encounter for immunization       Relevant Orders   Flu  vaccine HIGH DOSE PF(Fluzone Trivalent) (Completed)       No follow-ups on  file.    Leita HILARIO Adie, MD [facility] [place]

## 2024-09-14 DIAGNOSIS — H25813 Combined forms of age-related cataract, bilateral: Secondary | ICD-10-CM | POA: Diagnosis not present

## 2024-10-18 ENCOUNTER — Encounter: Payer: Self-pay | Admitting: Internal Medicine

## 2024-10-18 ENCOUNTER — Ambulatory Visit: Admitting: Internal Medicine

## 2024-10-18 VITALS — BP 128/78 | HR 61 | Ht 72.0 in | Wt 195.0 lb

## 2024-10-18 DIAGNOSIS — Z0000 Encounter for general adult medical examination without abnormal findings: Secondary | ICD-10-CM

## 2024-10-18 DIAGNOSIS — I10 Essential (primary) hypertension: Secondary | ICD-10-CM

## 2024-10-18 DIAGNOSIS — R311 Benign essential microscopic hematuria: Secondary | ICD-10-CM

## 2024-10-18 DIAGNOSIS — Z125 Encounter for screening for malignant neoplasm of prostate: Secondary | ICD-10-CM

## 2024-10-18 DIAGNOSIS — E782 Mixed hyperlipidemia: Secondary | ICD-10-CM

## 2024-10-18 MED ORDER — ROSUVASTATIN CALCIUM 5 MG PO TABS: 5.0000 mg | ORAL_TABLET | Freq: Every day | ORAL | 1 refills | Status: AC

## 2024-10-18 MED ORDER — LOSARTAN POTASSIUM 50 MG PO TABS: 50.0000 mg | ORAL_TABLET | Freq: Every day | ORAL | 1 refills | Status: AC

## 2024-10-18 NOTE — Progress Notes (Signed)
 Date:  10/18/2024   Name:  Anthony Foley   DOB:  [birth year]   MRN:  [id number]   Chief Complaint: Annual Exam Anthony Foley is a 73 y.o. male who presents today for his Complete Annual Exam. He feels well. He reports exercising 6 days per week, elliptical, 30-3mins. He reports he is sleeping poorly.   Health Maintenance  Topic Date Due   Medicare Annual Wellness Visit  01/06/2025   DTaP/Tdap/Td vaccine (2 - Td or Tdap) 10/15/2026   Colon Cancer Screening  10/22/2030   Pneumococcal Vaccine for age over 67  Completed   Flu Shot  Completed   Hepatitis C Screening  Completed   Zoster (Shingles) Vaccine  Completed   Meningitis B Vaccine  Aged Out   COVID-19 Vaccine  Discontinued   Lab Results  Component Value Date   PSA1 1.1 10/08/2023   PSA1 1.2 10/03/2022   PSA1 1.2 10/01/2021    Hypertension Pertinent negatives include no chest pain, headaches, palpitations or shortness of breath.  Hyperlipidemia Pertinent negatives include no chest pain or shortness of breath.  Insomnia Primary symptoms: sleep disturbance, difficulty falling asleep.   The problem occurs intermittently. The problem is unchanged. Exacerbated by: none. Past treatments include nothing.    Review of Systems  Constitutional:  Negative for fatigue and unexpected weight change.  HENT:  Negative for nosebleeds.   Eyes:  Negative for visual disturbance.  Respiratory:  Negative for cough, chest tightness, shortness of breath and wheezing.   Cardiovascular:  Negative for chest pain, palpitations and leg swelling.  Gastrointestinal:  Negative for abdominal pain, constipation and diarrhea.  Genitourinary:  Negative for dysuria, frequency and hematuria.  Musculoskeletal:  Positive for arthralgias (right knee). Negative for gait problem and joint swelling.  Skin:  Negative for pallor and rash.  Neurological:  Negative for dizziness, weakness, light-headedness and headaches.  Psychiatric/Behavioral:   Positive for sleep disturbance. Negative for dysphoric mood. The patient has insomnia. The patient is not nervous/anxious.      Lab Results  Component Value Date   NA 141 10/08/2023   K 4.4 10/08/2023   CO2 25 10/08/2023   GLUCOSE 85 10/08/2023   BUN 24 10/08/2023   CREATININE 1.13 10/08/2023   CALCIUM  9.4 10/08/2023   EGFR 69 10/08/2023   GFRNONAA 63 09/24/2020   Lab Results  Component Value Date   CHOL 169 10/08/2023   HDL 56 10/08/2023   LDLCALC 96 10/08/2023   TRIG 92 10/08/2023   CHOLHDL 3.0 10/08/2023   No results found for: TSH Lab Results  Component Value Date   HGBA1C 5.4 10/01/2021   Lab Results  Component Value Date   WBC 5.8 10/08/2023   HGB 15.5 10/08/2023   HCT 47.4 10/08/2023   MCV 93 10/08/2023   PLT 266 10/08/2023   Lab Results  Component Value Date   ALT 19 10/08/2023   AST 20 10/08/2023   ALKPHOS 101 10/08/2023   BILITOT 0.8 10/08/2023   Lab Results  Component Value Date   VD25OH 34.0 10/08/2023     Patient Active Problem List   Diagnosis Date Noted   History of colonic polyps 10/23/2023   Polyp of ascending colon 10/23/2023   Essential hypertension 05/08/2023   Benign microscopic hematuria 09/24/2020   Benign neoplasm of transverse colon    Pain of left hip joint 09/21/2018   Partial tear of left rotator cuff 04/21/2017   Nontraumatic rupture of long head of biceps tendon 04/21/2017  Osteoarthritis of knee 04/21/2017   Allergic rhinitis 04/21/2017   Vitamin D  deficiency, unspecified 06/25/2015   Overweight (BMI 25.0-29.9) 06/08/2015   Mixed hyperlipidemia 06/08/2015    Allergies[1]  Past Surgical History:  Procedure Laterality Date   CHOLECYSTECTOMY  1986   COLONOSCOPY  2007   divert- [place] [facility]   COLONOSCOPY WITH PROPOFOL  N/A 10/15/2018   Procedure: COLONOSCOPY WITH PROPOFOL ;  Surgeon: Jinny Carmine, MD;  Location: [facility];  Service: Endoscopy;  Laterality: N/A;   COLONOSCOPY WITH PROPOFOL  N/A 10/23/2023    Procedure: COLONOSCOPY WITH PROPOFOL  WITH POLYPECTOMY;  Surgeon: Jinny Carmine, MD;  Location: [facility];  Service: Endoscopy;  Laterality: N/A;   HERNIA REPAIR  2008   right inguinal   KNEE ARTHROSCOPY     POLYPECTOMY  10/15/2018   Procedure: POLYPECTOMY;  Surgeon: Jinny Carmine, MD;  Location: [facility];  Service: Endoscopy;;   SHOULDER SURGERY     TONSILLECTOMY      Social History[2]   Medication list has been reviewed and updated.  Active Medications[3]     08/09/2024    1:18 PM 02/17/2024    1:23 PM 12/14/2023    3:33 PM 05/08/2023    2:11 PM  GAD 7 : Generalized Anxiety Score  Nervous, Anxious, on Edge 0 0 0 0  Control/stop worrying 0 0 0 0  Worry too much - different things 0 0 0 0  Trouble relaxing 0 0 0 0  Restless 0 0 0 0  Easily annoyed or irritable 0 0 0 0  Afraid - awful might happen 0 0 0 0  Total GAD 7 Score 0 0 0 0  Anxiety Difficulty Not difficult at all Not difficult at all Not difficult at all Not difficult at all       10/18/2024    9:12 AM 08/09/2024    1:18 PM 02/17/2024    1:22 PM  Depression screen PHQ 2/9  Decreased Interest 0 0 0  Down, Depressed, Hopeless 0 0 0  PHQ - 2 Score 0 0 0  Altered sleeping  0 0  Tired, decreased energy  0 0  Change in appetite  0 0  Feeling bad or failure about yourself   0 0  Trouble concentrating  0 0  Moving slowly or fidgety/restless  0 0  Suicidal thoughts  0 0  PHQ-9 Score  0  0   Difficult doing work/chores  Not difficult at all Not difficult at all     Data saved with a previous flowsheet row definition    BP Readings from Last 3 Encounters:  10/18/24 128/78  08/09/24 120/62  02/17/24 124/78    Physical Exam Vitals and nursing note reviewed.  Constitutional:      Appearance: Normal appearance. He is well-developed.  HENT:     Head: Normocephalic.     Right Ear: Tympanic membrane, ear canal and external ear normal.     Left Ear: Tympanic membrane, ear canal and external ear  normal.     Nose: Nose normal.  Eyes:     Conjunctiva/sclera: Conjunctivae normal.     Pupils: Pupils are equal, round, and reactive to light.  Neck:     Thyroid: No thyromegaly.     Vascular: No carotid bruit.  Cardiovascular:     Rate and Rhythm: Normal rate and regular rhythm.     Heart sounds: Normal heart sounds. No murmur heard. Pulmonary:     Effort: Pulmonary effort is normal.  Breath sounds: Normal breath sounds. No wheezing.  Chest:  Breasts:    Right: No mass.     Left: No mass.  Abdominal:     General: Bowel sounds are normal.     Palpations: Abdomen is soft.     Tenderness: There is no abdominal tenderness.  Musculoskeletal:        General: Normal range of motion.     Cervical back: Normal range of motion and neck supple.     Right lower leg: No edema.     Left lower leg: No edema.  Lymphadenopathy:     Cervical: No cervical adenopathy.  Skin:    General: Skin is warm and dry.     Capillary Refill: Capillary refill takes less than 2 seconds.  Neurological:     General: No focal deficit present.     Mental Status: He is alert and oriented to person, place, and time.     Deep Tendon Reflexes: Reflexes are normal and symmetric.  Psychiatric:        Attention and Perception: Attention normal.        Mood and Affect: Mood normal.        Behavior: Behavior normal.        Thought Content: Thought content normal.     Wt Readings from Last 3 Encounters:  10/18/24 195 lb (88.5 kg)  08/09/24 192 lb (87.1 kg)  02/17/24 194 lb (88 kg)    BP 128/78   Pulse 61   Ht 6' (1.829 m)   Wt 195 lb (88.5 kg)   SpO2 97%   BMI 26.45 kg/m   Assessment and Plan:  Problem List Items Addressed This Visit       Unprioritized   Mixed hyperlipidemia   LDL is  Lab Results  Component Value Date   LDLCALC 96 10/08/2023    Current medication regimen is atorvastatin . Goal LDL is < 100.       Relevant Medications   losartan  (COZAAR ) 50 MG tablet   rosuvastatin   (CRESTOR ) 5 MG tablet   Other Relevant Orders   Lipid panel   Benign microscopic hematuria   Relevant Orders   Urinalysis, Routine w reflex microscopic   Essential hypertension (Chronic)   Well controlled blood pressure today. Current regimen is losartan  50 mg.   No medication side effects noted. Tolerating this dose well.        Relevant Medications   losartan  (COZAAR ) 50 MG tablet   rosuvastatin  (CRESTOR ) 5 MG tablet   Other Relevant Orders   CBC with Differential/Platelet   Comprehensive metabolic panel with GFR   TSH   Other Visit Diagnoses       Annual physical exam    -  Primary   Can try otc Melatonin or Benadryl prn insomnia up to date on immunizations and screenings     Prostate cancer screening       Relevant Orders   PSA       Return in about 6 months (around 04/18/2025) for HTN.    Leita HILARIO Adie, MD [facility] [place]           [1] No Known Allergies [2]  Social History Tobacco Use   Smoking status: Never   Smokeless tobacco: Never  Vaping Use   Vaping status: Never Used  Substance Use Topics   Alcohol use: Yes    Comment: 1 beer twice a year   Drug use: No  [3]  Current Meds  Medication Sig   cetirizine  (ZYRTEC ) 10 MG tablet Take 1 tablet (10 mg total) by mouth daily as needed for allergies.   Cholecalciferol (VITAMIN D -3) 1000 units CAPS Take 1 capsule (1,000 Units total) by mouth daily.   ibuprofen (ADVIL,MOTRIN) 200 MG tablet Take by mouth every 6 (six) hours as needed.   [DISCONTINUED] losartan  (COZAAR ) 50 MG tablet TAKE 1 TABLET BY MOUTH EVERY DAY   [DISCONTINUED] rosuvastatin  (CRESTOR ) 5 MG tablet TAKE 1 TABLET BY MOUTH EVERY DAY

## 2024-10-18 NOTE — Assessment & Plan Note (Signed)
 LDL is  Lab Results  Component Value Date   LDLCALC 96 10/08/2023    Current medication regimen is atorvastatin . Goal LDL is < 100.

## 2024-10-18 NOTE — Assessment & Plan Note (Signed)
 Well controlled blood pressure today. Current regimen is losartan  50 mg.   No medication side effects noted. Tolerating this dose well.

## 2024-10-19 ENCOUNTER — Ambulatory Visit: Payer: Self-pay | Admitting: Internal Medicine

## 2024-10-19 LAB — COMPREHENSIVE METABOLIC PANEL WITH GFR
ALT: 16 IU/L (ref 0–44)
AST: 18 IU/L (ref 0–40)
Albumin: 4.8 g/dL (ref 3.8–4.8)
Alkaline Phosphatase: 105 IU/L (ref 47–123)
BUN/Creatinine Ratio: 18 (ref 10–24)
BUN: 21 mg/dL (ref 8–27)
Bilirubin Total: 0.8 mg/dL (ref 0.0–1.2)
CO2: 24 mmol/L (ref 20–29)
Calcium: 9.6 mg/dL (ref 8.6–10.2)
Chloride: 104 mmol/L (ref 96–106)
Creatinine, Ser: 1.17 mg/dL (ref 0.76–1.27)
Globulin, Total: 2.5 g/dL (ref 1.5–4.5)
Glucose: 90 mg/dL (ref 70–99)
Potassium: 4.6 mmol/L (ref 3.5–5.2)
Sodium: 143 mmol/L (ref 134–144)
Total Protein: 7.3 g/dL (ref 6.0–8.5)
eGFR: 66 mL/min/1.73 (ref >59)

## 2024-10-19 LAB — TSH: TSH: 2.74 u[IU]/mL (ref 0.450–4.500)

## 2024-10-19 LAB — URINALYSIS, ROUTINE W REFLEX MICROSCOPIC
Bilirubin, UA: NEGATIVE
Glucose, UA: NEGATIVE
Ketones, UA: NEGATIVE
Leukocytes,UA: NEGATIVE
Nitrite, UA: NEGATIVE
RBC, UA: NEGATIVE
Specific Gravity, UA: 1.027 (ref 1.005–1.030)
Urobilinogen, Ur: 1 mg/dL (ref 0.2–1.0)
pH, UA: 5.5 (ref 5.0–7.5)

## 2024-10-19 LAB — CBC WITH DIFFERENTIAL/PLATELET
Basophils Absolute: 0 x10E3/uL (ref 0.0–0.2)
Basos: 0 %
EOS (ABSOLUTE): 0.1 x10E3/uL (ref 0.0–0.4)
Eos: 2 %
Hematocrit: 49.3 % (ref 37.5–51.0)
Hemoglobin: 15.7 g/dL (ref 13.0–17.7)
Immature Grans (Abs): 0 x10E3/uL (ref 0.0–0.1)
Immature Granulocytes: 0 %
Lymphocytes Absolute: 2.1 x10E3/uL (ref 0.7–3.1)
Lymphs: 31 %
MCH: 30 pg (ref 26.6–33.0)
MCHC: 31.8 g/dL (ref 31.5–35.7)
MCV: 94 fL (ref 79–97)
Monocytes Absolute: 0.5 x10E3/uL (ref 0.1–0.9)
Monocytes: 7 %
Neutrophils Absolute: 4.2 x10E3/uL (ref 1.4–7.0)
Neutrophils: 60 %
Platelets: 261 x10E3/uL (ref 150–450)
RBC: 5.23 x10E6/uL (ref 4.14–5.80)
RDW: 13.5 % (ref 11.6–15.4)
WBC: 6.9 x10E3/uL (ref 3.4–10.8)

## 2024-10-19 LAB — LIPID PANEL
Chol/HDL Ratio: 2.8 ratio (ref 0.0–5.0)
Cholesterol, Total: 152 mg/dL (ref 100–199)
HDL: 55 mg/dL (ref >39)
LDL Chol Calc (NIH): 74 mg/dL (ref 0–99)
Triglycerides: 134 mg/dL (ref 0–149)
VLDL Cholesterol Cal: 23 mg/dL (ref 5–40)

## 2024-10-19 LAB — PSA: Prostate Specific Ag, Serum: 1.5 ng/mL (ref 0.0–4.0)

## 2025-01-19 ENCOUNTER — Ambulatory Visit

## 2025-04-10 ENCOUNTER — Ambulatory Visit: Admitting: Family Medicine
